# Patient Record
Sex: Male | Born: 1965 | Race: White | Hispanic: No | Marital: Married | Smoking: Former smoker
Health system: Southern US, Community
[De-identification: ages and names within clinical notes are randomized; demographics above are authoritative.]

## PROBLEM LIST (undated history)

## (undated) DIAGNOSIS — I499 Cardiac arrhythmia, unspecified: Secondary | ICD-10-CM

## (undated) DIAGNOSIS — M1990 Unspecified osteoarthritis, unspecified site: Secondary | ICD-10-CM

## (undated) DIAGNOSIS — I4510 Unspecified right bundle-branch block: Secondary | ICD-10-CM

## (undated) DIAGNOSIS — I10 Essential (primary) hypertension: Secondary | ICD-10-CM

## (undated) DIAGNOSIS — N189 Chronic kidney disease, unspecified: Secondary | ICD-10-CM

## (undated) DIAGNOSIS — E669 Obesity, unspecified: Secondary | ICD-10-CM

## (undated) DIAGNOSIS — E785 Hyperlipidemia, unspecified: Secondary | ICD-10-CM

## (undated) DIAGNOSIS — S83249A Other tear of medial meniscus, current injury, unspecified knee, initial encounter: Secondary | ICD-10-CM

## (undated) DIAGNOSIS — Z905 Acquired absence of kidney: Secondary | ICD-10-CM

## (undated) DIAGNOSIS — G4730 Sleep apnea, unspecified: Secondary | ICD-10-CM

## (undated) DIAGNOSIS — G4733 Obstructive sleep apnea (adult) (pediatric): Secondary | ICD-10-CM

## (undated) HISTORY — PX: KIDNEY DONATION: SHX685

## (undated) HISTORY — PX: SALIVARY STONE REMOVAL: SHX5213

## (undated) HISTORY — PX: CARPAL TUNNEL RELEASE: SHX101

---

## 1997-10-02 HISTORY — PX: CARPAL TUNNEL RELEASE: SHX101

## 2008-02-17 ENCOUNTER — Ambulatory Visit (HOSPITAL_COMMUNITY): Admission: RE | Admit: 2008-02-17 | Discharge: 2008-02-17 | Payer: Self-pay | Admitting: Urology

## 2008-02-25 ENCOUNTER — Ambulatory Visit (HOSPITAL_COMMUNITY): Admission: RE | Admit: 2008-02-25 | Discharge: 2008-02-25 | Payer: Self-pay | Admitting: Family Medicine

## 2008-12-02 ENCOUNTER — Ambulatory Visit (HOSPITAL_COMMUNITY): Admission: RE | Admit: 2008-12-02 | Discharge: 2008-12-02 | Payer: Self-pay | Admitting: Family Medicine

## 2009-09-30 ENCOUNTER — Ambulatory Visit (HOSPITAL_COMMUNITY): Admission: RE | Admit: 2009-09-30 | Discharge: 2009-09-30 | Payer: Self-pay | Admitting: Family Medicine

## 2010-10-24 ENCOUNTER — Ambulatory Visit (HOSPITAL_COMMUNITY)
Admission: RE | Admit: 2010-10-24 | Discharge: 2010-10-24 | Payer: Self-pay | Source: Home / Self Care | Attending: Family Medicine | Admitting: Family Medicine

## 2011-10-28 ENCOUNTER — Ambulatory Visit (HOSPITAL_COMMUNITY): Payer: Self-pay | Attending: Emergency Medicine

## 2011-10-28 ENCOUNTER — Emergency Department (HOSPITAL_COMMUNITY)
Admission: EM | Admit: 2011-10-28 | Discharge: 2011-10-28 | Disposition: A | Discharge status: Home / Self Care | Payer: Worker's Compensation | Attending: Emergency Medicine | Admitting: Emergency Medicine

## 2011-10-28 ENCOUNTER — Encounter (HOSPITAL_COMMUNITY): Payer: Self-pay

## 2011-10-28 DIAGNOSIS — W010XXA Fall on same level from slipping, tripping and stumbling without subsequent striking against object, initial encounter: Secondary | ICD-10-CM | POA: Insufficient documentation

## 2011-10-28 DIAGNOSIS — S8000XA Contusion of unspecified knee, initial encounter: Secondary | ICD-10-CM | POA: Insufficient documentation

## 2011-10-28 DIAGNOSIS — M25569 Pain in unspecified knee: Secondary | ICD-10-CM | POA: Insufficient documentation

## 2011-10-28 DIAGNOSIS — Y9210 Unspecified residential institution as the place of occurrence of the external cause: Secondary | ICD-10-CM | POA: Insufficient documentation

## 2011-10-28 MED ORDER — IBUPROFEN 800 MG PO TABS
800.0000 mg | ORAL_TABLET | Freq: Once | ORAL | Status: DC
  Filled 2011-10-28: qty 1

## 2011-10-28 MED ORDER — NAPROXEN 500 MG PO TABS: 500.0000 mg | ORAL_TABLET | Freq: Two times a day (BID) | ORAL | Status: AC

## 2011-10-28 MED ORDER — OXYCODONE-ACETAMINOPHEN 5-325 MG PO TABS
2.0000 | ORAL_TABLET | Freq: Once | ORAL | Status: DC
  Filled 2011-10-28: qty 2

## 2011-10-28 MED ORDER — OXYCODONE-ACETAMINOPHEN 5-325 MG PO TABS: 1.0000 | ORAL_TABLET | Freq: Four times a day (QID) | ORAL | Status: AC | PRN
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
## 2013-10-02 HISTORY — PX: SALIVARY STONE REMOVAL: SHX5213

## 2014-01-21 ENCOUNTER — Ambulatory Visit: Payer: Self-pay | Admitting: Physician Assistant

## 2014-01-21 LAB — DOT URINE DIP
BLOOD: NEGATIVE
GLUCOSE, UR: NEGATIVE mg/dL (ref 0–75)
Specific Gravity: 1.01 (ref 1.003–1.030)

## 2015-02-06 ENCOUNTER — Encounter: Payer: Self-pay | Admitting: Emergency Medicine

## 2015-02-06 ENCOUNTER — Emergency Department
Admission: EM | Admit: 2015-02-06 | Discharge: 2015-02-06 | Disposition: A | Discharge status: Home / Self Care | Payer: BLUE CROSS/BLUE SHIELD | Attending: Emergency Medicine | Admitting: Emergency Medicine

## 2015-02-06 DIAGNOSIS — Y9389 Activity, other specified: Secondary | ICD-10-CM | POA: Insufficient documentation

## 2015-02-06 DIAGNOSIS — Z23 Encounter for immunization: Secondary | ICD-10-CM | POA: Insufficient documentation

## 2015-02-06 DIAGNOSIS — W275XXA Contact with paper-cutter, initial encounter: Secondary | ICD-10-CM | POA: Diagnosis not present

## 2015-02-06 DIAGNOSIS — Y998 Other external cause status: Secondary | ICD-10-CM | POA: Insufficient documentation

## 2015-02-06 DIAGNOSIS — S61512A Laceration without foreign body of left wrist, initial encounter: Secondary | ICD-10-CM | POA: Diagnosis not present

## 2015-02-06 DIAGNOSIS — Y9289 Other specified places as the place of occurrence of the external cause: Secondary | ICD-10-CM | POA: Insufficient documentation

## 2015-02-06 DIAGNOSIS — I10 Essential (primary) hypertension: Secondary | ICD-10-CM | POA: Insufficient documentation

## 2015-02-06 DIAGNOSIS — Z87891 Personal history of nicotine dependence: Secondary | ICD-10-CM | POA: Insufficient documentation

## 2015-02-06 HISTORY — DX: Essential (primary) hypertension: I10

## 2015-02-06 MED ORDER — OXYCODONE-ACETAMINOPHEN 5-325 MG PO TABS
1.0000 | ORAL_TABLET | Freq: Once | ORAL | Status: AC
  Administered 2015-02-06: 1 via ORAL

## 2015-02-06 MED ORDER — LIDOCAINE-EPINEPHRINE 1 %-1:200000 IJ SOLN
INTRAMUSCULAR | Status: AC
Start: 2015-02-06 — End: 2015-02-06
  Administered 2015-02-06: 16:00:00
  Filled 2015-02-06: qty 30

## 2015-02-06 MED ORDER — TETANUS-DIPHTHERIA TOXOIDS TD 5-2 LFU IM INJ
0.5000 mL | INJECTION | Freq: Once | INTRAMUSCULAR | Status: AC
  Administered 2015-02-06: 0.5 mL via INTRAMUSCULAR

## 2015-02-06 MED ORDER — OXYCODONE-ACETAMINOPHEN 5-325 MG PO TABS
ORAL_TABLET | ORAL | Status: AC
  Administered 2015-02-06: 1 via ORAL
  Filled 2015-02-06: qty 1

## 2015-02-06 MED ORDER — TETANUS-DIPHTHERIA TOXOIDS TD 5-2 LFU IM INJ
INJECTION | INTRAMUSCULAR | Status: AC
  Administered 2015-02-06: 0.5 mL via INTRAMUSCULAR
  Filled 2015-02-06: qty 0.5

## 2015-02-06 MED ORDER — TRAMADOL HCL 50 MG PO TABS: 50.0000 mg | ORAL_TABLET | Freq: Four times a day (QID) | ORAL | Status: DC | PRN
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
## 2015-02-06 NOTE — ED Notes (Signed)
Cutting rug, sliced wrist, bleeding controlled with pressure en route and now not actively bleeding

## 2017-09-27 ENCOUNTER — Encounter (INDEPENDENT_AMBULATORY_CARE_PROVIDER_SITE_OTHER): Payer: Self-pay | Admitting: *Deleted

## 2017-10-30 ENCOUNTER — Other Ambulatory Visit (HOSPITAL_COMMUNITY)
Admission: RE | Admit: 2017-10-30 | Discharge: 2017-10-30 | Disposition: A | Discharge status: Home / Self Care | Payer: BLUE CROSS/BLUE SHIELD | Source: Ambulatory Visit | Attending: Urology | Admitting: Urology

## 2017-10-30 ENCOUNTER — Ambulatory Visit (INDEPENDENT_AMBULATORY_CARE_PROVIDER_SITE_OTHER): Payer: BLUE CROSS/BLUE SHIELD | Admitting: Urology

## 2017-10-30 DIAGNOSIS — R3121 Asymptomatic microscopic hematuria: Secondary | ICD-10-CM | POA: Insufficient documentation

## 2017-10-30 LAB — URINALYSIS, COMPLETE (UACMP) WITH MICROSCOPIC
BACTERIA UA: NONE SEEN
BILIRUBIN URINE: NEGATIVE
Glucose, UA: NEGATIVE mg/dL
HGB URINE DIPSTICK: NEGATIVE
Ketones, ur: NEGATIVE mg/dL
LEUKOCYTES UA: NEGATIVE
NITRITE: NEGATIVE
Protein, ur: 30 mg/dL — AB
SPECIFIC GRAVITY, URINE: 1.016 (ref 1.005–1.030)
Squamous Epithelial / LPF: NONE SEEN
pH: 7 (ref 5.0–8.0)

## 2017-11-02 ENCOUNTER — Ambulatory Visit
Admission: EM | Admit: 2017-11-02 | Discharge: 2017-11-02 | Disposition: A | Discharge status: Home / Self Care | Payer: BLUE CROSS/BLUE SHIELD | Attending: Family Medicine | Admitting: Family Medicine

## 2017-11-02 ENCOUNTER — Encounter: Payer: Self-pay | Admitting: *Deleted

## 2017-11-02 DIAGNOSIS — L723 Sebaceous cyst: Secondary | ICD-10-CM | POA: Diagnosis not present

## 2017-11-02 NOTE — ED Provider Notes (Signed)
[facility]    CSN: [id number] Arrival date & time: 11/02/17  0803  History   Chief Complaint Chief Complaint  Patient presents with  . Abscess   HPI  52 year old male presents with a lump on his left shoulder.  She states that he has had this for approximately 1 year.  Has been getting larger.  He states that it has become painful over the past week.  He states that it is inflamed and red.  No fluctuance.  No drainage.  No known exacerbating relieving factors.  No reports of fevers or chills.  No other associated symptoms.  No other complaints or concerns at this time.  Past Medical History:  Diagnosis Date  . Hypertension    Past Surgical History:  Procedure Laterality Date  . KIDNEY DONATION     Family History Family History  Problem Relation Age of Onset  . Cancer Mother   . Kidney failure Father     Social History Social History   Tobacco Use  . Smoking status: Former Research scientist (life sciences)  . Smokeless tobacco: Never Used  Substance Use Topics  . Alcohol use: Yes    Alcohol/week: 8.4 oz    Types: 14 Cans of beer per week  . Drug use: No     Allergies   Sulfa antibiotics   Review of Systems Review of Systems  Constitutional: Negative.   Skin:       Lump/knot - left shoulder. No drainage. Red.   Physical Exam Triage Vital Signs ED Triage Vitals  Enc Vitals Group     BP 11/02/17 0815 (!) 131/91     Pulse Rate 11/02/17 0815 77     Resp 11/02/17 0815 16     Temp 11/02/17 0815 97.8 F (36.6 C)     Temp Source 11/02/17 0815 Oral     SpO2 11/02/17 0815 98 %     Weight 11/02/17 0817 225 lb (102.1 kg)     Height 11/02/17 0817 5\' 9"  (1.753 m)     Head Circumference --      Peak Flow --      Pain Score --      Pain Loc --      Pain Edu? --      Excl. in [place]? --    Updated Vital Signs BP (!) 131/91 (BP Location: Left Arm)   Pulse 77   Temp 97.8 F (36.6 C) (Oral)   Resp 16   Ht 5\' 9"  (1.753 m)   Wt 225 lb (102.1 kg)   SpO2 98%   BMI 33.23  kg/m   Physical Exam  Constitutional: He is oriented to person, place, and time. He appears well-developed and well-nourished. No distress.  HENT:  Head: Normocephalic and atraumatic.  Eyes: Conjunctivae are normal.  Pulmonary/Chest: Effort normal. No respiratory distress.  Neurological: He is alert and oriented to person, place, and time.  Skin:  Left posterior shoulder -3.5 cm circumferential firm area.  Mild surrounding erythema.  No fluctuance.  No drainage.  Appears to be consistent with a cyst.  Psychiatric: He has a normal mood and affect. His behavior is normal.  Nursing note and vitals reviewed.  UC Treatments / Results  Labs (all labs ordered are listed, but only abnormal results are displayed) Labs Reviewed - No data to display  EKG  EKG Interpretation None       Radiology No results found.  Procedures Incision and Drainage Date/Time: 11/02/2017 9:23 AM Performed by: Coral Spikes,  DO Authorized by: Coral Spikes, DO   Consent:    Consent obtained:  Verbal Location:    Type:  Cyst   Size:  3.5 cm   Location:  Upper extremity   Upper extremity location:  Shoulder   Shoulder location:  L shoulder Pre-procedure details:    Skin preparation:  Betadine Anesthesia (see MAR for exact dosages):    Anesthesia method:  Local infiltration   Local anesthetic:  Lidocaine 1% WITH epi Procedure type:    Complexity:  Simple Procedure details:    Incision types:  Single straight   Scalpel blade:  15   Drainage characteristics: Cyst contents; No pus.   Wound treatment: Wound closed with 3 simple interupted sutures. Post-procedure details:    Patient tolerance of procedure:  Tolerated well, no immediate complications   (including critical care time)  Medications Ordered in UC Medications - No data to display   Initial Impression / Assessment and Plan / UC Course  I have reviewed the triage vital signs and the nursing notes.  Pertinent labs & imaging results  that were available during my care of the patient were reviewed by me and considered in my medical decision making (see chart for details).     52 year old male presents with a sebaceous cyst.  Cyst removed today.  There is no pus.  As a result, I closed the wound with 3 simple interrupted stitches.  See above.  Sutures out in 7 days.  Final Clinical Impressions(s) / UC Diagnoses   Final diagnoses:  Sebaceous cyst    ED Discharge Orders    None     Controlled Substance Prescriptions [place] Controlled Substance Registry consulted? Not Applicable   Coral Spikes, [place] 11/02/17 1497

## 2017-11-02 NOTE — ED Triage Notes (Signed)
Abcess left shoulder x several weeks.

## 2017-11-05 ENCOUNTER — Telehealth: Payer: Self-pay | Admitting: *Deleted

## 2017-11-05 NOTE — Telephone Encounter (Signed)
Called patient, verified DOB, patient reported feeling better. Reminded patient to return to [facility] for suture removal.

## 2018-04-05 ENCOUNTER — Encounter: Payer: Self-pay | Admitting: Internal Medicine

## 2018-05-13 ENCOUNTER — Ambulatory Visit: Payer: Self-pay

## 2018-05-20 ENCOUNTER — Ambulatory Visit (INDEPENDENT_AMBULATORY_CARE_PROVIDER_SITE_OTHER): Payer: Self-pay

## 2018-05-20 DIAGNOSIS — Z1211 Encounter for screening for malignant neoplasm of colon: Secondary | ICD-10-CM

## 2018-05-20 MED ORDER — NA SULFATE-K SULFATE-MG SULF 17.5-3.13-1.6 GM/177ML PO SOLN: 1.0000 | ORAL | 0 refills | Status: DC

## 2018-05-20 NOTE — Patient Instructions (Addendum)
Rodney Brooks   April 17, 1966 MRN: [id number]    Procedure Date: 11/22/18 Time to register: 8:30am Place to register: [facility] Procedure Time: 9:30am Scheduled provider: Barney Drain, MD  PREPARATION FOR COLONOSCOPY WITH TRI-LYTE SPLIT PREP  Please notify us immediately if you are diabetic, take iron supplements, or if you are on Coumadin or any other blood thinners.   You will need to purchase 1 fleet enema and 1 box of Bisacodyl 53m tablets. ( you will need to purchase these seperately at your pharmacy)    1 DAY BEFORE PROCEDURE:  DATE: 11/21/18  DAY: Thursday clear liquids the entire day - NO SOLID FOOD.   At 2:00 pm:  Take 2 Bisacodyl tablets.   At 4:00pm:  Start drinking your solution. Make sure you mix well per instructions on the bottle. Try to drink 1 (one) 8 ounce glass every 10-15 minutes until you have consumed HALF the jug. You should complete by 6:00pm.You must keep the left over solution refrigerated until completed next day.  Continue clear liquids. You must drink plenty of clear liquids to prevent dehyration and kidney failure.     DAY OF PROCEDURE:   DATE: 11/22/18   DAY: Friday If you take medications for your heart, blood pressure or breathing, you may take these medications.   Five hours before your procedure time @ 4:30am:  Finish remaining amout of bowel prep, drinking 1 (one) 8 ounce glass every 10-15 minutes until complete. You have two hours to consume remaining prep.   Three hours before your procedure time @6 :30am:  Nothing by mouth.   At least one hour before going to the hospital:  Give yourself one Fleet enema. You may take your morning medications with sip of water unless we have instructed otherwise.      Please see below for Dietary Information.  CLEAR LIQUIDS INCLUDE:  Water Jello (NOT red in color)   Ice Popsicles (NOT red in color)   Tea (sugar ok, no milk/cream) Powdered fruit flavored drinks  Coffee (sugar ok, no  milk/cream) Gatorade/ Lemonade/ Kool-Aid  (NOT red in color)   Juice: apple, white grape, white cranberry Soft drinks  Clear bullion, consomme, broth (fat free beef/chicken/vegetable)  Carbonated beverages (any kind)  Strained chicken noodle soup Hard Candy   Remember: Clear liquids are liquids that will allow you to see your fingers on the other side of a clear glass. Be sure liquids are NOT red in color, and not cloudy, but CLEAR.  DO NOT EAT OR DRINK ANY OF THE FOLLOWING:  Dairy products of any kind   Cranberry juice Tomato juice / V8 juice   Grapefruit juice Orange juice     Red grape juice  Do not eat any solid foods, including such foods as: cereal, oatmeal, yogurt, fruits, vegetables, creamed soups, eggs, bread, crackers, pureed foods in a blender, etc.   HELPFUL HINTS FOR DRINKING PREP SOLUTION:   Make sure prep is extremely cold. Mix and refrigerate the the morning of the prep. You may also put in the freezer.   You may try mixing some Crystal Light or Country Time Lemonade if you prefer. Mix in small amounts; add more if necessary.  Try drinking through a straw  Rinse mouth with water or a mouthwash between glasses, to remove after-taste.  Try sipping on a cold beverage /ice/ popsicles between glasses of prep.  Place a piece of sugar-free hard candy in mouth between glasses.  If you become nauseated, try consuming  smaller amounts, or stretch out the time between glasses. Stop for 30-60 minutes, then slowly start back drinking.        OTHER INSTRUCTIONS  You will need a responsible adult at least 52 years of age to accompany you and drive you home. This person must remain in the waiting room during your procedure. The hospital will cancel your procedure if you do not have a responsible adult with you.   1. Wear loose fitting clothing that is easily removed. 2. Leave jewelry and other valuables at home.  3. Remove all body piercing jewelry and leave at  home. 4. Total time from sign-in until discharge is approximately 2-3 hours. 5. You should go home directly after your procedure and rest. You can resume normal activities the day after your procedure. 6. The day of your procedure you should not:  Drive  Make legal decisions  Operate machinery  Drink alcohol  Return to work   You may call the office (Dept: [phone]) before 5:00pm, or page the doctor on call [phone]) after 5:00pm, for further instructions, if necessary.   Insurance Information YOU WILL NEED TO CHECK WITH YOUR INSURANCE COMPANY FOR THE BENEFITS OF COVERAGE YOU HAVE FOR THIS PROCEDURE.  UNFORTUNATELY, NOT ALL INSURANCE COMPANIES HAVE BENEFITS TO COVER ALL OR PART OF THESE TYPES OF PROCEDURES.  IT IS YOUR RESPONSIBILITY TO CHECK YOUR BENEFITS, HOWEVER, WE WILL BE GLAD TO ASSIST YOU WITH ANY CODES YOUR INSURANCE COMPANY MAY NEED.    PLEASE NOTE THAT MOST INSURANCE COMPANIES WILL NOT COVER A SCREENING COLONOSCOPY FOR PEOPLE UNDER THE AGE OF 50  IF YOU HAVE BCBS INSURANCE, YOU MAY HAVE BENEFITS FOR A SCREENING COLONOSCOPY BUT IF POLYPS ARE FOUND THE DIAGNOSIS WILL CHANGE AND THEN YOU MAY HAVE A DEDUCTIBLE THAT WILL NEED TO BE MET. SO PLEASE MAKE SURE YOU CHECK YOUR BENEFITS FOR A SCREENING COLONOSCOPY AS WELL AS A DIAGNOSTIC COLONOSCOPY.

## 2018-05-20 NOTE — Progress Notes (Addendum)
Gastroenterology Pre-Procedure Review  Request Date:05/20/18 Requesting Physician: Burman Freestone NP [facility]- no previous tcs  PATIENT REVIEW QUESTIONS: The patient responded to the following health history questions as indicated:    1. Diabetes Melitis: no 2. Joint replacements in the past 12 months: no 3. Major health problems in the past 3 months: no 4. Has an artificial valve or MVP: no 5. Has a defibrillator: no 6. Has been advised in past to take antibiotics in advance of a procedure like teeth cleaning: no 7. Family history of colon cancer: no  8. Alcohol Use: yes (occasionally has a beer. ) 9. History of sleep apnea: yes (does not have cpap yet, he is currently being tested for sleep apnea)   10. History of coronary artery or other vascular stents placed within the last 12 months: no 11. History of any prior anesthesia complications: no    MEDICATIONS & ALLERGIES:    Patient reports the following regarding taking any blood thinners:   Plavix? no Aspirin? no Coumadin? no Brilinta? no Xarelto? no Eliquis? no Pradaxa? no Savaysa? no Effient? no  Patient confirms/reports the following medications:  Current Outpatient Medications  Medication Sig Dispense Refill  . hydrochlorothiazide (MICROZIDE) 12.5 MG capsule daily.  2  . lisinopril (PRINIVIL,ZESTRIL) 10 MG tablet Take by mouth daily.    . simvastatin (ZOCOR) 40 MG tablet daily.  0   No current facility-administered medications for this visit.     Patient confirms/reports the following allergies:  Allergies  Allergen Reactions  . Sulfa Antibiotics Rash    No orders of the defined types were placed in this encounter.   AUTHORIZATION INFORMATION Primary Insurance: [facility]   ID #: [id number] Pre-Cert / Josem Kaufmann required: yes Pre-Cert / Auth #: [id number]   SCHEDULE INFORMATION: Procedure has been scheduled as follows:  Date: 07/19/18, Time: 2:30 Location: [facility]  This Gastroenterology  Pre-Precedure Review Form is being routed to the following provider(s): Walden Field NP

## 2018-05-21 NOTE — Progress Notes (Signed)
Ok to schedule. Occasional beer doesn't necessitate propofol.

## 2018-05-22 ENCOUNTER — Telehealth: Payer: Self-pay

## 2018-05-22 MED ORDER — PEG 3350-KCL-NA BICARB-NACL 420 G PO SOLR: 4000.0000 mL | ORAL | 0 refills | Status: DC

## 2018-05-22 NOTE — Progress Notes (Signed)
Pt called- suprep is too expensive. rx sent for trilyte and new instructions mailed to the pt. See phone note.

## 2018-05-22 NOTE — Telephone Encounter (Signed)
trilyte sent to the pharmacy and new instructions mailed to the pt.

## 2018-05-22 NOTE — Telephone Encounter (Signed)
[facility] [employer] called office, Suprep is too expensive. Pt requests cheaper prep. Routing to [employer].

## 2018-05-22 NOTE — Addendum Note (Signed)
Addended by: Claudina Lick on: 05/22/2018 05:06 PM   Modules accepted: Orders

## 2018-07-23 NOTE — Progress Notes (Signed)
Pt got a new job and needed to reschedule. He has been rescheduled for 10/04/18 and Hoyle Sauer is aware. Pt already has prep and new instructions done and mailed to the pt.

## 2018-09-04 ENCOUNTER — Telehealth: Payer: Self-pay

## 2018-09-04 NOTE — Telephone Encounter (Signed)
Due to a change in [facility] schedule, I had to reschedule the pts colonoscopy. Tried to call home number, NA and no voicemail. Tried to call work number- it has been disconnected. No release on file to be able to speak with anyone else. I have moved his procedure date to 11/22/18 and mailed him a letter with new instructions. Asked him to call if he needed to reschedule to another date and time.

## 2018-11-22 ENCOUNTER — Other Ambulatory Visit: Payer: Self-pay

## 2018-11-22 ENCOUNTER — Ambulatory Visit (HOSPITAL_COMMUNITY)
Admission: RE | Admit: 2018-11-22 | Discharge: 2018-11-22 | Disposition: A | Discharge status: Home / Self Care | Payer: BLUE CROSS/BLUE SHIELD | Attending: Gastroenterology | Admitting: Gastroenterology

## 2018-11-22 ENCOUNTER — Encounter (HOSPITAL_COMMUNITY)
Admission: RE | Disposition: A | Discharge status: Home / Self Care | Payer: Self-pay | Source: Home / Self Care | Attending: Gastroenterology

## 2018-11-22 ENCOUNTER — Encounter (HOSPITAL_COMMUNITY): Payer: Self-pay | Admitting: *Deleted

## 2018-11-22 DIAGNOSIS — I10 Essential (primary) hypertension: Secondary | ICD-10-CM | POA: Insufficient documentation

## 2018-11-22 DIAGNOSIS — K5730 Diverticulosis of large intestine without perforation or abscess without bleeding: Secondary | ICD-10-CM | POA: Diagnosis not present

## 2018-11-22 DIAGNOSIS — K644 Residual hemorrhoidal skin tags: Secondary | ICD-10-CM | POA: Insufficient documentation

## 2018-11-22 DIAGNOSIS — Z1211 Encounter for screening for malignant neoplasm of colon: Secondary | ICD-10-CM

## 2018-11-22 DIAGNOSIS — Z79899 Other long term (current) drug therapy: Secondary | ICD-10-CM | POA: Diagnosis not present

## 2018-11-22 DIAGNOSIS — K648 Other hemorrhoids: Secondary | ICD-10-CM | POA: Insufficient documentation

## 2018-11-22 DIAGNOSIS — Z87891 Personal history of nicotine dependence: Secondary | ICD-10-CM | POA: Insufficient documentation

## 2018-11-22 HISTORY — PX: COLONOSCOPY: SHX5424

## 2018-11-22 SURGERY — COLONOSCOPY
Anesthesia: Moderate Sedation

## 2018-11-22 MED ORDER — MIDAZOLAM HCL 5 MG/5ML IJ SOLN
INTRAMUSCULAR | Status: DC | PRN
  Administered 2018-11-22 (×2): 2 mg via INTRAVENOUS

## 2018-11-22 MED ORDER — SODIUM CHLORIDE 0.9 % IV SOLN
INTRAVENOUS | Status: DC
  Administered 2018-11-22: 1000 mL via INTRAVENOUS

## 2018-11-22 MED ORDER — MEPERIDINE HCL 100 MG/ML IJ SOLN
INTRAMUSCULAR | Status: DC | PRN
  Administered 2018-11-22 (×2): 25 mg

## 2018-11-22 MED ORDER — MEPERIDINE HCL 100 MG/ML IJ SOLN
INTRAMUSCULAR | Status: AC
  Filled 2018-11-22: qty 2

## 2018-11-22 MED ORDER — PROMETHAZINE HCL 25 MG/ML IJ SOLN
INTRAMUSCULAR | Status: AC
  Filled 2018-11-22: qty 1

## 2018-11-22 MED ORDER — MIDAZOLAM HCL 5 MG/5ML IJ SOLN
INTRAMUSCULAR | Status: AC
  Filled 2018-11-22: qty 10

## 2018-11-22 MED ORDER — PROMETHAZINE HCL 25 MG/ML IJ SOLN
INTRAMUSCULAR | Status: DC | PRN
  Administered 2018-11-22: 12.5 mg via INTRAVENOUS

## 2018-11-22 NOTE — H&P (Signed)
Primary Care Physician:  Joyice Faster, FNP Primary Gastroenterologist:  Dr. Oneida Alar  Pre-Procedure History & Physical: HPI:  Rodney Brooks is a 53 y.o. male here for [place].  Past Medical History:  Diagnosis Date  . Hypertension     Past Surgical History:  Procedure Laterality Date  . CARPAL TUNNEL RELEASE Bilateral   . KIDNEY DONATION      Prior to Admission medications   Medication Sig Start Date End Date Taking? Authorizing Provider  hydrochlorothiazide (MICROZIDE) 12.5 MG capsule daily. 04/30/18  Yes [provider]  lisinopril (PRINIVIL,ZESTRIL) 10 MG tablet Take by mouth daily.   Yes [provider]  polyethylene glycol-electrolytes (TRILYTE) 420 g solution Take 4,000 mLs by mouth as directed. 05/22/18  Yes Carlis Stable, NP  simvastatin (ZOCOR) 40 MG tablet daily. 04/30/18  Yes [provider]  Na Sulfate-K Sulfate-Mg Sulf (SUPREP BOWEL PREP KIT) 17.5-3.13-1.6 GM/177ML SOLN Take 1 kit by mouth as directed. 05/20/18   Carlis Stable, NP    Allergies as of 05/20/2018 - Review Complete 05/20/2018  Allergen Reaction Noted  . Sulfa antibiotics Rash 10/28/2011    Family History  Problem Relation Age of Onset  . Cancer Mother   . Kidney failure Father     Social History   Socioeconomic History  . Marital status: Married    Spouse name: Not on file  . Number of children: Not on file  . Years of education: Not on file  . Highest education level: Not on file  Occupational History  . Not on file  Social Needs  . Financial resource strain: Not on file  . Food insecurity:    Worry: Not on file    Inability: Not on file  . Transportation needs:    Medical: Not on file    Non-medical: Not on file  Tobacco Use  . Smoking status: Former Research scientist (life sciences)  . Smokeless tobacco: Current User    Types: Snuff  Substance and Sexual Activity  . Alcohol use: Yes    Alcohol/week: 14.0 standard drinks    Types: 14 Cans of beer per week   . Drug use: No  . Sexual activity: Not on file  Lifestyle  . Physical activity:    Days per week: Not on file    Minutes per session: Not on file  . Stress: Not on file  Relationships  . Social connections:    Talks on phone: Not on file    Gets together: Not on file    Attends religious service: Not on file    Active member of club or organization: Not on file    Attends meetings of clubs or organizations: Not on file    Relationship status: Not on file  . Intimate partner violence:    Fear of current or ex partner: Not on file    Emotionally abused: Not on file    Physically abused: Not on file    Forced sexual activity: Not on file  Other Topics Concern  . Not on file  Social History Narrative  . Not on file    Review of Systems: See HPI, otherwise negative ROS   Physical Exam: BP 133/84   Pulse 79   Temp 99.2 F (37.3 C) (Oral)   Resp 13   Ht _0  (1.753 m)   Wt 107 kg   SpO2 98%   BMI 34.85 kg/m  General:   Alert,  pleasant and cooperative in NAD Head:  Normocephalic  and atraumatic. Neck:  Supple; Lungs:  Clear throughout to auscultation.    Heart:  Regular rate and rhythm. Abdomen:  Soft, nontender and nondistended. Normal bowel sounds, without guarding, and without rebound.   Neurologic:  Alert and  oriented x4;  grossly normal neurologically.  Impression/Plan:    SCREENING  Plan:  1. TCS TODAY DISCUSSED PROCEDURE, BENEFITS, & RISKS: < 1% chance of medication reaction, bleeding, perforation, or rupture of spleen/liver.

## 2018-11-22 NOTE — Discharge Instructions (Signed)
You DID NOT HAVE ANY POLYPS. YOU HAVE DIVERTICULOSIS IN YOUR RIGHT COLON. You have internal AND EXTERNAL hemorrhoids.   DRINK WATER TO KEEP URINE LIGHT YELLOW.  CONTINUE YOUR WEIGHT LOSS EFFORTS. YOUR BODY MASS INDEX IS OVER 30 WHICH MEANS YOU ARE OBESE. OBESITY IS ASSOCIATED WITH AN INCREASED FOR CIRRHOSIS AND ALL CANCERS, INCLUDING ESOPHAGEAL AND COLON CANCER. A WEIGHT OF 200 LBS OR LESS  WILL GET YOUR BODY MASS INDEX(BMI) UNDER 30.   FOLLOW A HIGH FIBER DIET. AVOID ITEMS THAT CAUSE BLOATING & GAS. SEE INFO BELOW.   USE PREPARATION H FOUR TIMES  A DAY IF NEEDED TO RELIEVE RECTAL PAIN/PRESSURE/BLEEDING.  Next colonoscopy in 10 years.   Colonoscopy Care After Read the instructions outlined below and refer to this sheet in the next week. These discharge instructions provide you with general information on caring for yourself after you leave the hospital. While your treatment has been planned according to the most current medical practices available, unavoidable complications occasionally occur. If you have any problems or questions after discharge, call DR. Nadirah Socorro, [phone].  ACTIVITY  You may resume your regular activity, but move at a slower pace for the next 24 hours.   Take frequent rest periods for the next 24 hours.   Walking will help get rid of the air and reduce the bloated feeling in your belly (abdomen).   No driving for 24 hours (because of the medicine (anesthesia) used during the test).   You may shower.   Do not sign any important legal documents or operate any machinery for 24 hours (because of the anesthesia used during the test).    NUTRITION  Drink plenty of fluids.   You may resume your normal diet as instructed by your doctor.   Begin with a light meal and progress to your normal diet. Heavy or fried foods are harder to digest and may make you feel sick to your stomach (nauseated).   Avoid alcoholic beverages for 24 hours or as instructed.     MEDICATIONS  You may resume your normal medications.   WHAT YOU CAN EXPECT TODAY  Some feelings of bloating in the abdomen.   Passage of more gas than usual.   Spotting of blood in your stool or on the toilet paper  .  IF YOU HAD POLYPS REMOVED DURING THE COLONOSCOPY:  Eat a soft diet IF YOU HAVE NAUSEA, BLOATING, ABDOMINAL PAIN, OR VOMITING.    FINDING OUT THE RESULTS OF YOUR TEST Not all test results are available during your visit. DR. Oneida Alar WILL CALL YOU WITHIN 7 DAYS OF YOUR PROCEDUE WITH YOUR RESULTS. Do not assume everything is normal if you have not heard from DR. Lev Cervone IN ONE WEEK, CALL HER OFFICE AT [phone].  SEEK IMMEDIATE MEDICAL ATTENTION AND CALL THE OFFICE: [phone] IF:  You have more than a spotting of blood in your stool.   Your belly is swollen (abdominal distention).   You are nauseated or vomiting.   You have a temperature over 101F.   You have abdominal pain or discomfort that is severe or gets worse throughout the day.    High-Fiber Diet A high-fiber diet changes your normal diet to include more whole grains, legumes, fruits, and vegetables. Changes in the diet involve replacing refined carbohydrates with unrefined foods. The calorie level of the diet is essentially unchanged. The Dietary Reference Intake (recommended amount) for adult males is 38 grams per day. For adult females, it is 25 grams per day. Pregnant and  lactating women should consume 28 grams of fiber per day. Fiber is the intact part of a plant that is not broken down during digestion. Functional fiber is fiber that has been isolated from the plant to provide a beneficial effect in the body.  PURPOSE  Increase stool bulk.   Ease and regulate bowel movements.   Lower cholesterol.   REDUCE RISK OF COLON CANCER  INDICATIONS THAT YOU NEED MORE FIBER  Constipation and hemorrhoids.   Uncomplicated diverticulosis (intestine condition) and irritable bowel syndrome.    Weight management.   As a protective measure against hardening of the arteries (atherosclerosis), diabetes, and cancer.   GUIDELINES FOR INCREASING FIBER IN THE DIET  Start adding fiber to the diet slowly. A gradual increase of about 5 more grams (2 slices of whole-wheat bread, 2 servings of most fruits or vegetables, or 1 bowl of high-fiber cereal) per day is best. Too rapid an increase in fiber may result in constipation, flatulence, and bloating.   Drink enough water and fluids to keep your urine clear or pale yellow. Water, juice, or caffeine-free drinks are recommended. Not drinking enough fluid may cause constipation.   Eat a variety of high-fiber foods rather than one type of fiber.   Try to increase your intake of fiber through using high-fiber foods rather than fiber pills or supplements that contain small amounts of fiber.   The goal is to change the types of food eaten. Do not supplement your present diet with high-fiber foods, but replace foods in your present diet.   INCLUDE A VARIETY OF FIBER SOURCES  Replace refined and processed grains with whole grains, canned fruits with fresh fruits, and incorporate other fiber sources. White rice, white breads, and most bakery goods contain little or no fiber.   Brown whole-grain rice, buckwheat oats, and many fruits and vegetables are all good sources of fiber. These include: broccoli, Brussels sprouts, cabbage, cauliflower, beets, sweet potatoes, white potatoes (skin on), carrots, tomatoes, eggplant, squash, berries, fresh fruits, and dried fruits.   Cereals appear to be the richest source of fiber. Cereal fiber is found in whole grains and bran. Bran is the fiber-rich outer coat of cereal grain, which is largely removed in refining. In whole-grain cereals, the bran remains. In breakfast cereals, the largest amount of fiber is found in those with "bran" in their names. The fiber content is sometimes indicated on the label.   You may  need to include additional fruits and vegetables each day.   In baking, for 1 cup white flour, you may use the following substitutions:   1 cup whole-wheat flour minus 2 tablespoons.   1/2 cup white flour plus 1/2 cup whole-wheat flour.   Diverticulosis Diverticulosis is a common condition that develops when small pouches (diverticula) form in the wall of the colon. The risk of diverticulosis increases with age. It happens more often in people who eat a low-fiber diet. Most individuals with diverticulosis have no symptoms. Those individuals with symptoms usually experience belly (abdominal) pain, constipation, or loose stools (diarrhea).  HOME CARE INSTRUCTIONS  Increase the amount of fiber in your diet as directed by your caregiver or dietician. This may reduce symptoms of diverticulosis.   Drink at least 6 to 8 glasses of water each day to prevent constipation.   Try not to strain when you have a bowel movement.   THERE IS NO NEED TO Avoid nuts and seeds to prevent complications.   FOODS HAVING HIGH FIBER CONTENT INCLUDE:  Fruits. Apple, peach, pear, tangerine, raisins, prunes.   Vegetables. Brussels sprouts, asparagus, broccoli, cabbage, carrot, cauliflower, romaine lettuce, spinach, summer squash, tomato, winter squash, zucchini.   Starchy Vegetables. Baked beans, kidney beans, lima beans, split peas, lentils, potatoes (with skin).   Grains. Whole wheat bread, brown rice, bran flake cereal, plain oatmeal, white rice, shredded wheat, bran muffins.

## 2018-11-22 NOTE — Op Note (Signed)
[facility] Patient Name: Rodney Brooks Procedure Date: 11/22/2018 9:21 AM MRN: [id number] Date of Birth: 06-Nov-1965 Attending MD: Barney Drain MD, MD CSN: [id number] Age: 53 Admit Type: Outpatient Procedure:                Colonoscopy, SCREENING Indications:              Screening for colorectal malignant neoplasm Providers:                Barney Drain MD, MD, Janeece Riggers, RN, Nelma Rothman,                            Technician Referring MD:             Joyice Faster Medicines:                Promethazine 12.5 mg IV, Meperidine 50 mg IV,                            Midazolam 4 mg IV Complications:            No immediate complications. Estimated Blood Loss:     Estimated blood loss: none. Procedure:                Pre-Anesthesia Assessment:                           - Prior to the procedure, a History and Physical                            was performed, and patient medications and                            allergies were reviewed. The patient's tolerance of                            previous anesthesia was also reviewed. The risks                            and benefits of the procedure and the sedation                            options and risks were discussed with the patient.                            All questions were answered, and informed consent                            was obtained. Prior Anticoagulants: The patient has                            taken no previous anticoagulant or antiplatelet                            agents. ASA Grade Assessment: II - A patient with  mild systemic disease. After reviewing the risks                            and benefits, the patient was deemed in                            satisfactory condition to undergo the procedure.                            After obtaining informed consent, the colonoscope                            was passed under direct vision. Throughout the   procedure, the patient's blood pressure, pulse, and                            oxygen saturations were monitored continuously. The                            CF-[id number] ([id number]) scope was introduced through                            the anus and advanced to the the cecum, identified                            by appendiceal orifice and ileocecal valve. The                            colonoscopy was performed with ease. The patient                            tolerated the procedure well. The quality of the                            bowel preparation was good. The ileocecal valve,                            appendiceal orifice, and rectum were photographed. Scope In: 9:38:02 AM Scope Out: 9:47:19 AM Scope Withdrawal Time: 0 hours 7 minutes 56 seconds  Total Procedure Duration: 0 hours 9 minutes 17 seconds  Findings:      A few small-mouthed diverticula were found in the cecum.      The exam was otherwise without abnormality.      External and internal hemorrhoids were found. Impression:               - Diverticulosis in the cecum.                           - The examination was otherwise normal.                           - External and internal hemorrhoids. Moderate Sedation:      Moderate (conscious) sedation was administered by the endoscopy nurse       and supervised by the endoscopist. The following parameters  were       monitored: oxygen saturation, heart rate, blood pressure, and response       to care. Total physician intraservice time was 21 minutes. Recommendation:           - Patient has a contact number available for                            emergencies. The signs and symptoms of potential                            delayed complications were discussed with the                            patient. Return to normal activities tomorrow.                            Written discharge instructions were provided to the                            patient.                            - High fiber diet.                           - Continue present medications. LOSE WEIGHT TO BMI                            < 30.                           - Repeat colonoscopy in 10 years for surveillance. Procedure Code(s):        --- Professional ---                           [phone], Colonoscopy, flexible; diagnostic, including                            collection of specimen(s) by brushing or washing,                            when performed (separate procedure)                           G0500, Moderate sedation services provided by the                            same physician or other qualified health care                            professional performing a gastrointestinal                            endoscopic service that sedation supports,  requiring the presence of an independent trained                            observer to assist in the monitoring of the                            patient's level of consciousness and physiological                            status; initial 15 minutes of intra-service time;                            patient age 64 years or older (additional time may                            be reported with [phone], as appropriate) Diagnosis Code(s):        --- Professional ---                           Z12.11, Encounter for screening for malignant                            neoplasm of colon                           K64.8, Other hemorrhoids                           K57.30, Diverticulosis of large intestine without                            perforation or abscess without bleeding CPT copyright 2018 American [employer] Association. All rights reserved. The codes documented in this report are preliminary and upon coder review may  be revised to meet current compliance requirements. Barney Drain, MD Barney Drain MD, MD 11/22/2018 10:55:45 AM This report has been signed electronically. Number of Addenda: 0

## 2018-11-26 ENCOUNTER — Encounter (HOSPITAL_COMMUNITY): Payer: Self-pay | Admitting: Gastroenterology

## 2019-03-20 ENCOUNTER — Other Ambulatory Visit: Payer: Self-pay | Admitting: Student

## 2019-03-20 DIAGNOSIS — M25561 Pain in right knee: Secondary | ICD-10-CM

## 2019-03-20 DIAGNOSIS — M2391 Unspecified internal derangement of right knee: Secondary | ICD-10-CM

## 2019-03-25 ENCOUNTER — Other Ambulatory Visit: Payer: Self-pay

## 2019-03-25 ENCOUNTER — Ambulatory Visit
Admission: RE | Admit: 2019-03-25 | Discharge: 2019-03-25 | Disposition: A | Discharge status: Home / Self Care | Payer: BC Managed Care – PPO | Source: Ambulatory Visit | Attending: Student | Admitting: Student

## 2019-03-25 DIAGNOSIS — M2391 Unspecified internal derangement of right knee: Secondary | ICD-10-CM

## 2019-03-25 DIAGNOSIS — M25561 Pain in right knee: Secondary | ICD-10-CM

## 2019-04-18 ENCOUNTER — Other Ambulatory Visit: Payer: Self-pay

## 2019-04-18 ENCOUNTER — Encounter
Admission: RE | Admit: 2019-04-18 | Discharge: 2019-04-18 | Disposition: A | Discharge status: Home / Self Care | Payer: BC Managed Care – PPO | Source: Ambulatory Visit | Attending: Surgery | Admitting: Surgery

## 2019-04-18 DIAGNOSIS — Z01818 Encounter for other preprocedural examination: Secondary | ICD-10-CM | POA: Insufficient documentation

## 2019-04-18 DIAGNOSIS — I10 Essential (primary) hypertension: Secondary | ICD-10-CM | POA: Diagnosis not present

## 2019-04-18 HISTORY — DX: Sleep apnea, unspecified: G47.30

## 2019-04-18 HISTORY — DX: Acquired absence of kidney: Z90.5

## 2019-04-18 NOTE — Patient Instructions (Signed)
Your procedure is scheduled on: 04-24-19 THURSDAY Report to Same Day Surgery 2nd floor medical mall [facility] Entrance-take elevator on left to 2nd floor.  Check in with surgery information desk.) To find out your arrival time please call [phone] between 1PM - 3PM on 04-23-19 [facility]  Remember: Instructions that are not followed completely may result in serious medical risk, up to and including death, or upon the discretion of your surgeon and anesthesiologist your surgery may need to be rescheduled.    _x___ 1. Do not eat food after midnight the night before your procedure. NO GUM OR CANDY AFTER MIDNIGHT. You may drink clear liquids up to 2 hours before you are scheduled to arrive at the hospital for your procedure.  Do not drink clear liquids within 2 hours of your scheduled arrival to the hospital.  Clear liquids include  --Water or Apple juice without pulp  --Clear carbohydrate beverage such as ClearFast or Gatorade  --Black Coffee or Clear Tea (No milk, no creamers, do not add anything to the coffee or Tea   ____Ensure clear carbohydrate drink on the way to the hospital for bariatric patients  ____Ensure clear carbohydrate drink 3 hours before surgery for Dr Dwyane Luo patients if physician instructed.     __x__ 2. No Alcohol for 24 hours before or after surgery.   __x__3. No Smoking or e-cigarettes for 24 prior to surgery.  Do not use any chewable tobacco products for at least 6 hour prior to surgery   ____  4. Bring all medications with you on the day of surgery if instructed.    __x__ 5. Notify your doctor if there is any change in your medical condition     (cold, fever, infections).    x___6. On the morning of surgery brush your teeth with toothpaste and water.  You may rinse your mouth with mouth wash if you wish.  Do not swallow any toothpaste or mouthwash.   Do not wear jewelry, make-up, hairpins, clips or nail polish.  Do not wear lotions, powders, or perfumes.  You may wear deodorant.  Do not shave 48 hours prior to surgery. Men may shave face and neck.  Do not bring valuables to the hospital.    [facility] is not responsible for any belongings or valuables.               Contacts, dentures or bridgework may not be worn into surgery.  Leave your suitcase in the car. After surgery it may be brought to your room.  For patients admitted to the hospital, discharge time is determined by your treatment team.  _  Patients discharged the day of surgery will not be allowed to drive home.  You will need someone to drive you home and stay with you the night of your procedure.    Please read over the following fact sheets that you were given:   [facility] Preparing for Surgery   _x___ TAKE THE FOLLOWING MEDICATION THE MORNING OF SURGERY WITH A SMALL SIP OF WATER. These include:  1. ZOCOR (SIMVASTATIN)   2.  3.  4.  5.  6.  ____Fleets enema or Magnesium Citrate as directed.   _x___ Use CHG Soap or sage wipes as directed on instruction sheet   ____ Use inhalers on the day of surgery and bring to hospital day of surgery  ____ Stop Metformin and Janumet 2 days prior to surgery.    ____ Take 1/2 of usual insulin dose the night  before surgery and none on the morning surgery.   ____ Follow recommendations from Cardiologist, Pulmonologist or PCP regarding stopping Aspirin, Coumadin, Plavix ,Eliquis, Effient, or Pradaxa, and Pletal.  X____Stop Anti-inflammatories such as Advil, Aleve, Ibuprofen, Motrin, Naproxen, Naprosyn, Goodies powders or aspirin products NOW- OK to take Tylenol    ____ Stop supplements until after surgery.     ____ Bring C-Pap to the hospital.

## 2019-04-21 ENCOUNTER — Other Ambulatory Visit: Payer: Self-pay

## 2019-04-21 ENCOUNTER — Encounter
Admission: RE | Admit: 2019-04-21 | Discharge: 2019-04-21 | Disposition: A | Discharge status: Home / Self Care | Payer: BC Managed Care – PPO | Source: Ambulatory Visit | Attending: Surgery | Admitting: Surgery

## 2019-04-21 DIAGNOSIS — Z1159 Encounter for screening for other viral diseases: Secondary | ICD-10-CM | POA: Insufficient documentation

## 2019-04-21 DIAGNOSIS — Z01818 Encounter for other preprocedural examination: Secondary | ICD-10-CM | POA: Insufficient documentation

## 2019-04-21 LAB — POTASSIUM: Potassium: 3.8 mmol/L (ref 3.5–5.1)

## 2019-04-22 ENCOUNTER — Other Ambulatory Visit: Payer: BC Managed Care – PPO

## 2019-04-22 LAB — SARS CORONAVIRUS 2 (TAT 6-24 HRS): SARS Coronavirus 2: NEGATIVE

## 2019-04-23 MED ORDER — CEFAZOLIN SODIUM-DEXTROSE 2-4 GM/100ML-% IV SOLN
2.0000 g | Freq: Once | INTRAVENOUS | Status: AC
  Administered 2019-04-24: 2 g via INTRAVENOUS

## 2019-04-24 ENCOUNTER — Ambulatory Visit: Payer: BC Managed Care – PPO | Admitting: Certified Registered"

## 2019-04-24 ENCOUNTER — Ambulatory Visit
Admission: RE | Admit: 2019-04-24 | Discharge: 2019-04-24 | Disposition: A | Discharge status: Home / Self Care | Payer: BC Managed Care – PPO | Attending: Surgery | Admitting: Surgery

## 2019-04-24 ENCOUNTER — Encounter: Payer: Self-pay | Admitting: *Deleted

## 2019-04-24 ENCOUNTER — Encounter
Admission: RE | Disposition: A | Discharge status: Home / Self Care | Payer: Self-pay | Source: Home / Self Care | Attending: Surgery

## 2019-04-24 ENCOUNTER — Other Ambulatory Visit: Payer: Self-pay

## 2019-04-24 DIAGNOSIS — X58XXXA Exposure to other specified factors, initial encounter: Secondary | ICD-10-CM | POA: Insufficient documentation

## 2019-04-24 DIAGNOSIS — Z7982 Long term (current) use of aspirin: Secondary | ICD-10-CM | POA: Insufficient documentation

## 2019-04-24 DIAGNOSIS — Z79899 Other long term (current) drug therapy: Secondary | ICD-10-CM | POA: Insufficient documentation

## 2019-04-24 DIAGNOSIS — S83231A Complex tear of medial meniscus, current injury, right knee, initial encounter: Secondary | ICD-10-CM | POA: Insufficient documentation

## 2019-04-24 DIAGNOSIS — Z882 Allergy status to sulfonamides status: Secondary | ICD-10-CM | POA: Diagnosis not present

## 2019-04-24 DIAGNOSIS — F17220 Nicotine dependence, chewing tobacco, uncomplicated: Secondary | ICD-10-CM | POA: Diagnosis not present

## 2019-04-24 DIAGNOSIS — M1711 Unilateral primary osteoarthritis, right knee: Secondary | ICD-10-CM | POA: Diagnosis not present

## 2019-04-24 DIAGNOSIS — I10 Essential (primary) hypertension: Secondary | ICD-10-CM | POA: Insufficient documentation

## 2019-04-24 DIAGNOSIS — G4730 Sleep apnea, unspecified: Secondary | ICD-10-CM | POA: Insufficient documentation

## 2019-04-24 HISTORY — PX: KNEE ARTHROSCOPY WITH MEDIAL MENISECTOMY: SHX5651

## 2019-04-24 SURGERY — ARTHROSCOPY, KNEE, WITH MEDIAL MENISCECTOMY
Anesthesia: General | Site: Knee | Laterality: Right

## 2019-04-24 MED ORDER — BUPIVACAINE-EPINEPHRINE (PF) 0.5% -1:200000 IJ SOLN
INTRAMUSCULAR | Status: DC | PRN
  Administered 2019-04-24 (×2): 30 mL via PERINEURAL

## 2019-04-24 MED ORDER — FENTANYL CITRATE (PF) 100 MCG/2ML IJ SOLN
INTRAMUSCULAR | Status: DC | PRN
  Administered 2019-04-24 (×2): 50 ug via INTRAVENOUS

## 2019-04-24 MED ORDER — CEFAZOLIN SODIUM-DEXTROSE 2-4 GM/100ML-% IV SOLN
INTRAVENOUS | Status: AC
  Filled 2019-04-24: qty 100

## 2019-04-24 MED ORDER — OXYCODONE HCL 5 MG PO TABS
ORAL_TABLET | ORAL | Status: AC
  Administered 2019-04-24: 15:00:00 5 mg via ORAL
  Filled 2019-04-24: qty 1

## 2019-04-24 MED ORDER — PROPOFOL 10 MG/ML IV BOLUS
INTRAVENOUS | Status: AC
  Filled 2019-04-24: qty 20

## 2019-04-24 MED ORDER — DEXAMETHASONE SODIUM PHOSPHATE 10 MG/ML IJ SOLN
INTRAMUSCULAR | Status: DC | PRN
  Administered 2019-04-24: 10 mg via INTRAVENOUS

## 2019-04-24 MED ORDER — LACTATED RINGERS IV SOLN
INTRAVENOUS | Status: DC
  Administered 2019-04-24: 11:00:00 via INTRAVENOUS

## 2019-04-24 MED ORDER — FAMOTIDINE 20 MG PO TABS
20.0000 mg | ORAL_TABLET | Freq: Once | ORAL | Status: AC
  Administered 2019-04-24: 20 mg via ORAL

## 2019-04-24 MED ORDER — OXYCODONE HCL 5 MG PO TABS: 5.0000 mg | ORAL_TABLET | ORAL | 0 refills | Status: DC | PRN

## 2019-04-24 MED ORDER — ONDANSETRON HCL 4 MG/2ML IJ SOLN: 4.0000 mg | Freq: Once | INTRAMUSCULAR | Status: DC | PRN

## 2019-04-24 MED ORDER — LIDOCAINE HCL (CARDIAC) PF 100 MG/5ML IV SOSY
PREFILLED_SYRINGE | INTRAVENOUS | Status: DC | PRN
  Administered 2019-04-24: 100 mg via INTRAVENOUS

## 2019-04-24 MED ORDER — FENTANYL CITRATE (PF) 100 MCG/2ML IJ SOLN
25.0000 ug | INTRAMUSCULAR | Status: DC | PRN
  Administered 2019-04-24 (×3): 25 ug via INTRAVENOUS

## 2019-04-24 MED ORDER — FENTANYL CITRATE (PF) 100 MCG/2ML IJ SOLN
INTRAMUSCULAR | Status: AC
  Administered 2019-04-24: 15:00:00 25 ug via INTRAVENOUS
  Filled 2019-04-24: qty 2

## 2019-04-24 MED ORDER — PROPOFOL 10 MG/ML IV BOLUS
INTRAVENOUS | Status: DC | PRN
  Administered 2019-04-24: 150 mg via INTRAVENOUS

## 2019-04-24 MED ORDER — LIDOCAINE HCL (PF) 2 % IJ SOLN
INTRAMUSCULAR | Status: AC
  Filled 2019-04-24: qty 10

## 2019-04-24 MED ORDER — FENTANYL CITRATE (PF) 100 MCG/2ML IJ SOLN
INTRAMUSCULAR | Status: AC
  Filled 2019-04-24: qty 2

## 2019-04-24 MED ORDER — OXYCODONE HCL 5 MG/5ML PO SOLN: 5.0000 mg | Freq: Once | ORAL | Status: AC | PRN

## 2019-04-24 MED ORDER — BUPIVACAINE-EPINEPHRINE (PF) 0.5% -1:200000 IJ SOLN
INTRAMUSCULAR | Status: AC
  Filled 2019-04-24: qty 30

## 2019-04-24 MED ORDER — SEVOFLURANE IN SOLN
RESPIRATORY_TRACT | Status: AC
  Filled 2019-04-24: qty 250

## 2019-04-24 MED ORDER — LIDOCAINE HCL 1 % IJ SOLN
INTRAMUSCULAR | Status: DC | PRN
  Administered 2019-04-24: 30 mL

## 2019-04-24 MED ORDER — ONDANSETRON HCL 4 MG/2ML IJ SOLN: 4.0000 mg | Freq: Four times a day (QID) | INTRAMUSCULAR | Status: DC | PRN

## 2019-04-24 MED ORDER — MIDAZOLAM HCL 2 MG/2ML IJ SOLN
INTRAMUSCULAR | Status: AC
  Filled 2019-04-24: qty 2

## 2019-04-24 MED ORDER — OXYCODONE HCL 5 MG PO TABS
5.0000 mg | ORAL_TABLET | Freq: Once | ORAL | Status: AC | PRN
  Administered 2019-04-24: 5 mg via ORAL

## 2019-04-24 MED ORDER — METOCLOPRAMIDE HCL 10 MG PO TABS: 5.0000 mg | ORAL_TABLET | Freq: Three times a day (TID) | ORAL | Status: DC | PRN

## 2019-04-24 MED ORDER — OXYCODONE HCL 5 MG PO TABS: 5.0000 mg | ORAL_TABLET | ORAL | Status: DC | PRN

## 2019-04-24 MED ORDER — FAMOTIDINE 20 MG PO TABS
ORAL_TABLET | ORAL | Status: AC
  Administered 2019-04-24: 20 mg via ORAL
  Filled 2019-04-24: qty 1

## 2019-04-24 MED ORDER — LIDOCAINE HCL (PF) 1 % IJ SOLN
INTRAMUSCULAR | Status: AC
  Filled 2019-04-24: qty 30

## 2019-04-24 MED ORDER — METOCLOPRAMIDE HCL 5 MG/ML IJ SOLN: 5.0000 mg | Freq: Three times a day (TID) | INTRAMUSCULAR | Status: DC | PRN

## 2019-04-24 MED ORDER — POTASSIUM CHLORIDE IN NACL 20-0.9 MEQ/L-% IV SOLN: INTRAVENOUS | Status: DC

## 2019-04-24 MED ORDER — ONDANSETRON HCL 4 MG PO TABS: 4.0000 mg | ORAL_TABLET | Freq: Four times a day (QID) | ORAL | Status: DC | PRN

## 2019-04-24 SURGICAL SUPPLY — 49 items
"PENCIL ELECTRO HAND CTR " (MISCELLANEOUS) ×1 IMPLANT
APL PRP STRL LF DISP 70% ISPRP (MISCELLANEOUS) ×1
BAG COUNTER SPONGE EZ (MISCELLANEOUS) IMPLANT
BAG SPNG 4X4 CLR HAZ (MISCELLANEOUS)
BLADE FULL RADIUS 3.5 (BLADE) ×3 IMPLANT
BLADE SHAVER 4.5X7 STR FR (MISCELLANEOUS) ×3 IMPLANT
BNDG ELASTIC 6X5.8 VLCR STR LF (GAUZE/BANDAGES/DRESSINGS) ×3 IMPLANT
CARTRIDGE SUT 2-0 NONSTITCH (Anchor) ×8 IMPLANT
CHLORAPREP W/TINT 26 (MISCELLANEOUS) ×3 IMPLANT
COUNTER SPONGE BAG EZ (MISCELLANEOUS)
COVER WAND RF STERILE (DRAPES) ×3 IMPLANT
CUFF TOURN SGL QUICK 24 (TOURNIQUET CUFF)
CUFF TOURN SGL QUICK 30 (TOURNIQUET CUFF) ×3
CUFF TRNQT CYL 24X4X16.5-23 (TOURNIQUET CUFF) IMPLANT
CUFF TRNQT CYL 30X4X21-28X (TOURNIQUET CUFF) IMPLANT
DRAPE SPLIT 6X30 W/TAPE (DRAPES) ×3 IMPLANT
ELECT REM PT RETURN 9FT ADLT (ELECTROSURGICAL) ×3
ELECTRODE REM PT RTRN 9FT ADLT (ELECTROSURGICAL) ×1 IMPLANT
FASTFIX NDL DEL SYS 360 CVD (Miscellaneous) ×6 IMPLANT
GAUZE SPONGE 4X4 12PLY STRL (GAUZE/BANDAGES/DRESSINGS) ×3 IMPLANT
GLOVE BIO SURGEON STRL SZ8 (GLOVE) ×6 IMPLANT
GLOVE BIOGEL M 7.0 STRL (GLOVE) ×6 IMPLANT
GLOVE BIOGEL PI IND STRL 7.5 (GLOVE) ×1 IMPLANT
GLOVE BIOGEL PI INDICATOR 7.5 (GLOVE) ×2
GLOVE INDICATOR 8.0 STRL GRN (GLOVE) ×3 IMPLANT
GOWN STRL REUS W/ TWL LRG LVL3 (GOWN DISPOSABLE) ×1 IMPLANT
GOWN STRL REUS W/ TWL XL LVL3 (GOWN DISPOSABLE) ×2 IMPLANT
GOWN STRL REUS W/TWL LRG LVL3 (GOWN DISPOSABLE) ×3
GOWN STRL REUS W/TWL XL LVL3 (GOWN DISPOSABLE) ×6
IV LACTATED RINGER IRRG 3000ML (IV SOLUTION) ×3
IV LR IRRIG 3000ML ARTHROMATIC (IV SOLUTION) ×1 IMPLANT
KIT TURNOVER KIT A (KITS) ×3 IMPLANT
MANAGER SUT NOVOCUT (CUTTER) ×2 IMPLANT
MANIFOLD NEPTUNE II (INSTRUMENTS) ×3 IMPLANT
MENISCAL REPAIR SYSTEM SZ 0 (Anchor) ×3 IMPLANT
NDL HYPO 21X1.5 SAFETY (NEEDLE) ×1 IMPLANT
NEEDLE HYPO 21X1.5 SAFETY (NEEDLE) ×3 IMPLANT
NOVOSTICH PRO MENISCAL 2-0 (Miscellaneous) ×3 IMPLANT
PACK ARTHROSCOPY KNEE (MISCELLANEOUS) ×3 IMPLANT
PENCIL ELECTRO HAND CTR (MISCELLANEOUS) ×3 IMPLANT
PUSHER KNOT ARTHRO STRT FASTFI (MISCELLANEOUS) ×2 IMPLANT
SUT PROLENE 4 0 PS 2 18 (SUTURE) ×3 IMPLANT
SUT TICRON COATED BLUE 2 0 30 (SUTURE) IMPLANT
SYR 50ML LL SCALE MARK (SYRINGE) ×3 IMPLANT
SYSTEM NDL DEL FSTFX  360 CVD (Miscellaneous) IMPLANT
SYSTEM NVSTCH PRO MENISCAL 2-0 (Miscellaneous) IMPLANT
SYSTEM REPAIR MENISCAL SZ 0 (Anchor) IMPLANT
TUBING ARTHRO INFLOW-ONLY STRL (TUBING) ×3 IMPLANT
WAND WEREWOLF FLOW 90D (MISCELLANEOUS) ×3 IMPLANT

## 2019-04-24 NOTE — Discharge Instructions (Addendum)
Orthopedic discharge instructions: Keep dressing dry and intact.  May shower after dressing changed on post-op day #4 (Sunday).  Cover sutures with Band-Aids after drying off. Apply ice frequently to knee. Take ibuprofen 800 mg TID with meals for 7-10 days, then as necessary. Take oxycodone as prescribed when needed.  May supplement with ES Tylenol if necessary. No weight-bearing on right leg.  Use crutches for ambulation. Keep brace on and locked in extension during ambulation.  May unlock brace for comfort when lying down. Follow-up in 10-14 days or as scheduled.  AMBULATORY SURGERY  DISCHARGE INSTRUCTIONS   1) The drugs that you were given will stay in your system until tomorrow so for the next 24 hours you should not:  A) Drive an automobile B) Make any legal decisions C) Drink any alcoholic beverage   2) You may resume regular meals tomorrow.  Today it is better to start with liquids and gradually work up to solid foods.  You may eat anything you prefer, but it is better to start with liquids, then soup and crackers, and gradually work up to solid foods.   3) Please notify your doctor immediately if you have any unusual bleeding, trouble breathing, redness and pain at the surgery site, drainage, fever, or pain not relieved by medication.    4) Additional Instructions:        Please contact your physician with any problems or Same Day Surgery at [phone], Monday through Friday 6 am to 4 pm, or [place] at [facility] number at [phone].

## 2019-04-24 NOTE — Transfer of Care (Signed)
Immediate Anesthesia Transfer of Care Note  Patient: Rodney Brooks.  Procedure(s) Performed: KNEE ARTHROSCOPY WITH DEBRIDEMENT AND REPAIR VS. PARTIAL MEDIAL MENISECTOMY (Right Knee)  Patient Location: PACU  Anesthesia Type:General  Level of Consciousness: awake, alert  and oriented  Airway & Oxygen Therapy: Patient Spontanous Breathing and Patient connected to face mask oxygen  Post-op Assessment: Report given to RN and Post -op Vital signs reviewed and stable  Post vital signs: Reviewed and stable  Last Vitals:  Vitals Value Taken Time  BP 159/98 04/24/19 1434  Temp 36.8 C 04/24/19 1434  Pulse 95 04/24/19 1436  Resp 42 04/24/19 1436  SpO2 100 % 04/24/19 1436  Vitals shown include unvalidated device data.  Last Pain:  Vitals:   04/24/19 1039  TempSrc: Oral  PainSc: 1          Complications: No apparent anesthesia complications

## 2019-04-24 NOTE — Op Note (Signed)
04/24/2019  2:39 PM  Patient:   Rodney A Lantry Jr.  Pre-Op Diagnosis:   Complex medial meniscus tear with underlying degenerative joint disease, right knee.  Postoperative diagnosis:   Same  Procedure:   Arthroscopic medial meniscus repair with abrasion chondroplasty of grade 3 chondromalacial changes involving the medial femoral condyle, medial tibial plateau, lateral tibial plateau, and patella, right knee.  Surgeon:   Pascal Lux, M.D.  Anesthesia:   General LMA.  Findings:   As above. The anterior and posterior cruciate ligaments both were in satisfactory condition, as was the lateral meniscus.  Complications:   None.  EBL:   5 cc.  Total fluids:   800 cc of crystalloid.  Tourniquet time:   None  Drains:   None  Closure:   4-0 Prolene interrupted sutures.  Brief clinical note:   The patient is a 53 year old male who sustained the above-noted injury about 8 weeks ago when he felt a sharp pop in the posterior medial aspect of his right knee while trying to get up into his truck. His symptoms have persisted despite medications, activity modification, etc. His history and examination are consistent with a medial meniscus tear confirmed by MRI scan. The patient presents at this time for arthroscopy, debridement, and repair versus partial medial meniscectomy.  Procedure:   The patient was brought into the operating room and lain in the supine position. After adequate general laryngeal mask anesthesia was obtained, a timeout was performed to verify the appropriate side. The patient's right knee was injected sterilely using a solution of 30 cc of 1% lidocaine and 30 cc of 0.5% Sensorcaine with epinephrine. The right lower extremity was prepped with ChloraPrep solution before being draped sterilely. Preoperative antibiotics were administered. The expected portal sites were injected with 0.5% Sensorcaine with epinephrine before the camera was placed in the anterolateral portal and  instrumentation performed through the anteromedial portal.   The knee was sequentially examined beginning in the suprapatellar pouch, then progressing to the patellofemoral space, the medial gutter and compartment, the notch, and finally the lateral compartment and gutter. The findings were as described above. Abundant reactive synovial tissues anteriorly were debrided using the full-radius resector in order to improve visualization. Areas of grade II-III chondromalacia involving the weightbearing portions of the medial femoral condyle and the medial tibial plateau, as well as the anterior portion of the lateral tibial plateau all were debrided back to stable margins using the full-radius resector. In addition, there was an area of grade II-III chondromalacia involving the central ridge of the patella which also required debridement. The medial meniscus was carefully probed. There was a horizontal tear involving the posteromedial corner of the meniscus and a radial tear close to, but not at, the root of the medial meniscus. After several attempts, one suture was able to be passed across the radial tear using the [facility] Ceterix device. The horizontal tear was repaired using two [employer] FasT-Fix 360 devices. Subsequent probing of the repair demonstrated good stability. The instruments were removed from the joint after suctioning the excess fluid.   The portal sites were closed using 4-0 Prolene interrupted sutures before a sterile bulky dressing was applied to the knee. The patient was then placed into a hinged knee brace with the hinges set at 0 to 90 degrees, but locked in extension, before he was awakened, extubated, and returned to the recovery room in satisfactory condition after tolerating the procedure well.

## 2019-04-24 NOTE — Anesthesia Preprocedure Evaluation (Signed)
Anesthesia Evaluation  Patient identified by MRN, date of birth, ID band Patient awake    Reviewed: Allergy & Precautions, NPO status , Patient's Chart, lab work & pertinent test results  History of Anesthesia Complications Negative for: history of anesthetic complications  Airway Mallampati: II       Dental   Pulmonary sleep apnea and Continuous Positive Airway Pressure Ventilation , neg COPD, Current Smoker,           Cardiovascular hypertension, Pt. on medications (-) Past MI and (-) CHF (-) dysrhythmias (-) Valvular Problems/Murmurs     Neuro/Psych neg Seizures    GI/Hepatic Neg liver ROS, neg GERD  ,  Endo/Other  neg diabetes  Renal/GU negative Renal ROS     Musculoskeletal   Abdominal   Peds  Hematology   Anesthesia Other Findings   Reproductive/Obstetrics                             Anesthesia Physical Anesthesia Plan  ASA: II  Anesthesia Plan: General   Post-op Pain Management:    Induction: Intravenous  PONV Risk Score and Plan: 1  Airway Management Planned: LMA  Additional Equipment:   Intra-op Plan:   Post-operative Plan:   Informed Consent: I have reviewed the patients History and Physical, chart, labs and discussed the procedure including the risks, benefits and alternatives for the proposed anesthesia with the patient or authorized representative who has indicated his/her understanding and acceptance.       Plan Discussed with:   Anesthesia Plan Comments:         Anesthesia Quick Evaluation

## 2019-04-24 NOTE — Anesthesia Procedure Notes (Signed)
Procedure Name: Intubation Performed by: Philbert Riser, CRNA Pre-anesthesia Checklist: Patient identified, Emergency Drugs available, Suction available, Patient being monitored and Timeout performed Patient Re-evaluated:Patient Re-evaluated prior to induction Oxygen Delivery Method: Circle system utilized and Simple face mask Preoxygenation: Pre-oxygenation with 100% oxygen Induction Type: IV induction Ventilation: Mask ventilation without difficulty LMA Size: 4.5 Placement Confirmation: positive ETCO2 and breath sounds checked- equal and bilateral Dental Injury: Teeth and Oropharynx as per pre-operative assessment

## 2019-04-24 NOTE — H&P (Signed)
Paper H&P to be scanned into permanent record. H&P reviewed and patient re-examined. No changes. 

## 2019-04-24 NOTE — Anesthesia Post-op Follow-up Note (Signed)
Anesthesia QCDR form completed.        

## 2019-04-24 NOTE — Anesthesia Postprocedure Evaluation (Signed)
Anesthesia Post Note  Patient: Rodney Brooks.  Procedure(s) Performed: KNEE ARTHROSCOPY WITH DEBRIDEMENT AND REPAIR VS. PARTIAL MEDIAL MENISECTOMY (Right Knee)  Patient location during evaluation: PACU Anesthesia Type: General Level of consciousness: awake and alert and oriented Pain management: pain level controlled Vital Signs Assessment: post-procedure vital signs reviewed and stable Respiratory status: spontaneous breathing, nonlabored ventilation and respiratory function stable Cardiovascular status: blood pressure returned to baseline and stable Postop Assessment: no signs of nausea or vomiting Anesthetic complications: no     Last Vitals:  Vitals:   04/24/19 1505 04/24/19 1511  BP:  (!) 159/97  Pulse: 95 93  Resp: 13 16  Temp:  36.8 C  SpO2: 96% 97%    Last Pain:  Vitals:   04/24/19 1505  TempSrc:   PainSc: 5                  Abelina Ketron

## 2019-04-25 ENCOUNTER — Encounter: Payer: Self-pay | Admitting: Surgery

## 2019-04-25 NOTE — Addendum Note (Signed)
Addendum  created 04/25/19 0909 by Hedda Slade, CRNA   Charge Capture section accepted

## 2020-03-30 ENCOUNTER — Other Ambulatory Visit (HOSPITAL_COMMUNITY): Payer: Self-pay | Admitting: Family

## 2020-03-30 ENCOUNTER — Other Ambulatory Visit: Payer: Self-pay | Admitting: Family

## 2020-03-30 DIAGNOSIS — N5089 Other specified disorders of the male genital organs: Secondary | ICD-10-CM

## 2020-04-02 ENCOUNTER — Other Ambulatory Visit: Payer: Self-pay

## 2020-04-02 ENCOUNTER — Ambulatory Visit (HOSPITAL_COMMUNITY)
Admission: RE | Admit: 2020-04-02 | Discharge: 2020-04-02 | Disposition: A | Discharge status: Home / Self Care | Payer: BC Managed Care – PPO | Source: Ambulatory Visit | Attending: Family | Admitting: Family

## 2020-04-02 DIAGNOSIS — N5089 Other specified disorders of the male genital organs: Secondary | ICD-10-CM

## 2020-05-06 ENCOUNTER — Other Ambulatory Visit: Payer: Self-pay | Admitting: Student

## 2020-05-06 DIAGNOSIS — M25562 Pain in left knee: Secondary | ICD-10-CM

## 2020-05-25 ENCOUNTER — Other Ambulatory Visit: Payer: Self-pay

## 2020-05-25 ENCOUNTER — Ambulatory Visit
Admission: RE | Admit: 2020-05-25 | Discharge: 2020-05-25 | Disposition: A | Discharge status: Home / Self Care | Payer: BC Managed Care – PPO | Source: Ambulatory Visit | Attending: Student | Admitting: Student

## 2020-05-25 DIAGNOSIS — M25562 Pain in left knee: Secondary | ICD-10-CM | POA: Diagnosis not present

## 2020-06-14 ENCOUNTER — Ambulatory Visit: Payer: BC Managed Care – PPO | Admitting: Internal Medicine

## 2020-07-24 IMAGING — US US SCROTUM W/ DOPPLER COMPLETE
1 series · 14 of 25 positions shown · non-contrast
Comparison: None.

CLINICAL DATA: Right scrotal swelling

EXAM:
SCROTAL ULTRASOUND
DOPPLER ULTRASOUND OF THE TESTICLES
TECHNIQUE: Complete ultrasound examination of the testicles, epididymis, and
other scrotal structures was performed. Color and spectral Doppler
ultrasound were also utilized to evaluate blood flow to the
testicles.

[Series 1: us scrotum w/ doppler complete · 0.06mm/px · 14 of 119 slices shown]
[im 1/119]
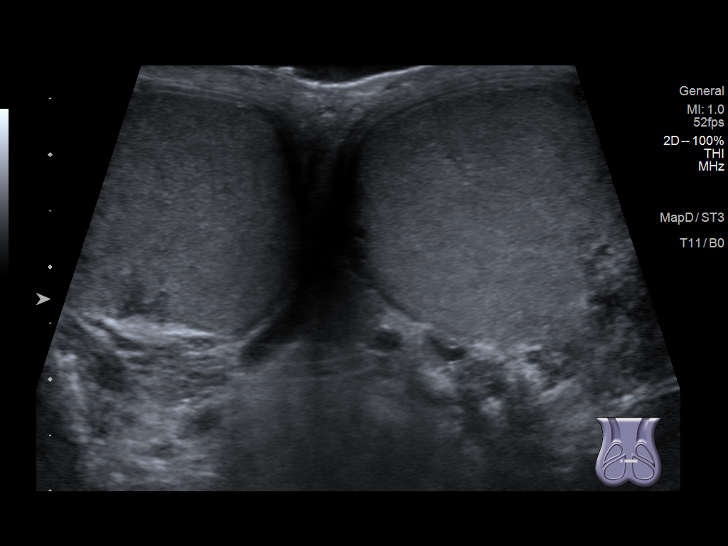
[im 10/119]
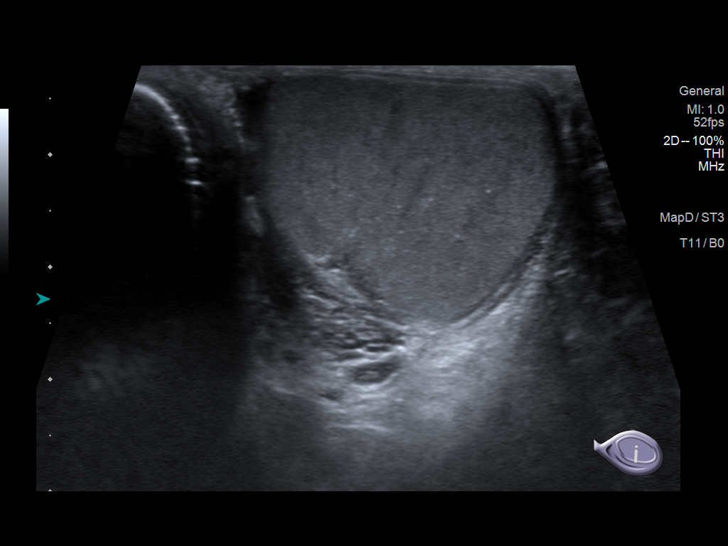
[im 20/119]
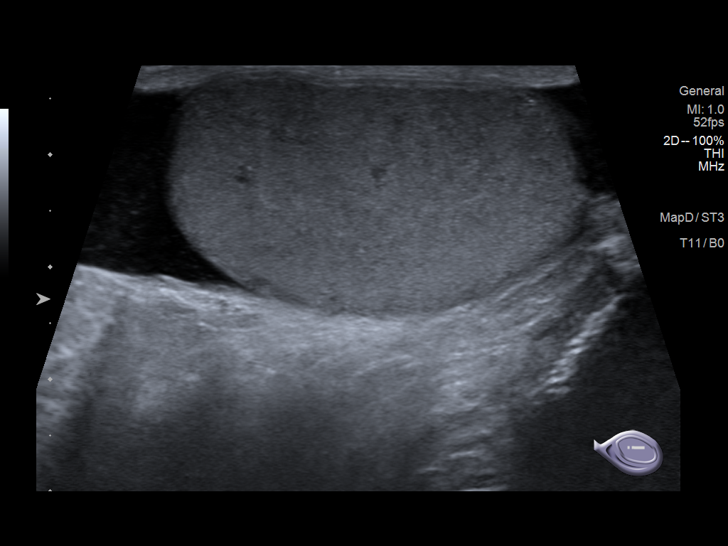
[im 30/119]
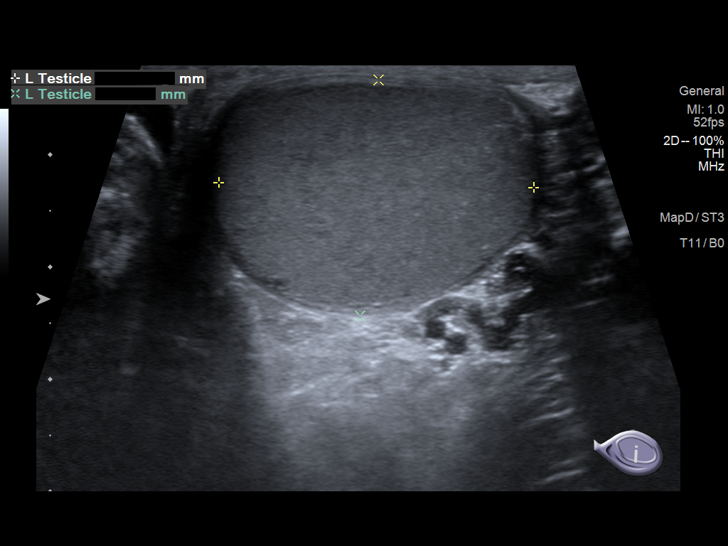
[im 40/119]
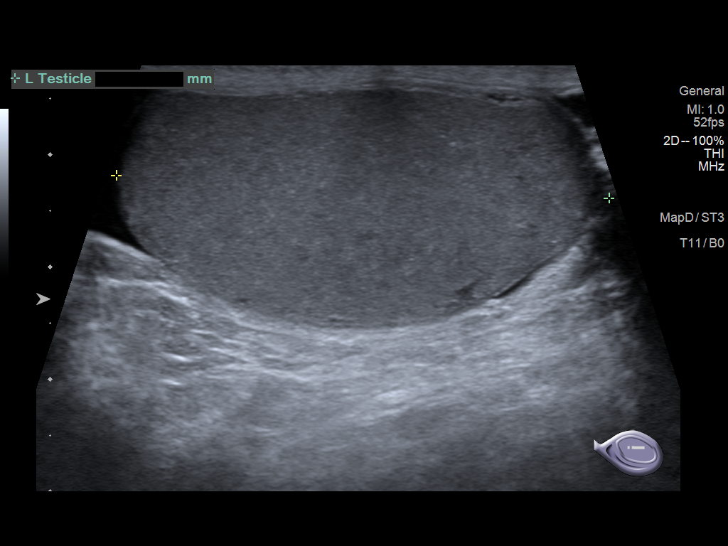
[im 45/119]
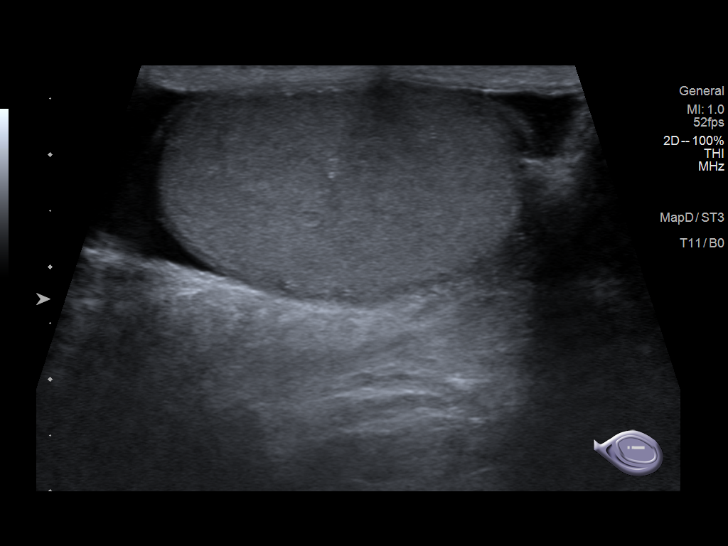
[im 55/119]
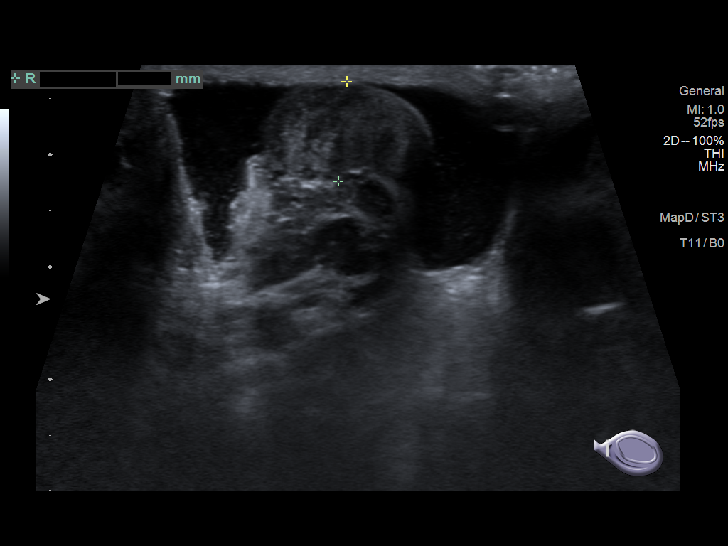
[im 64/119]
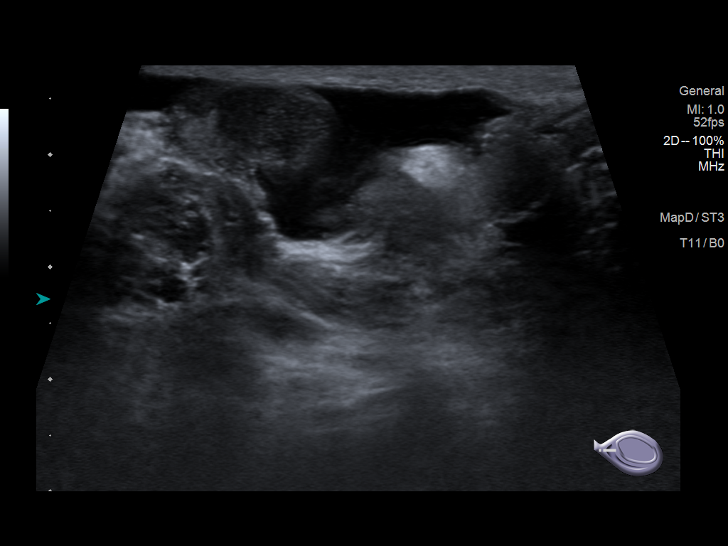
[im 74/119]
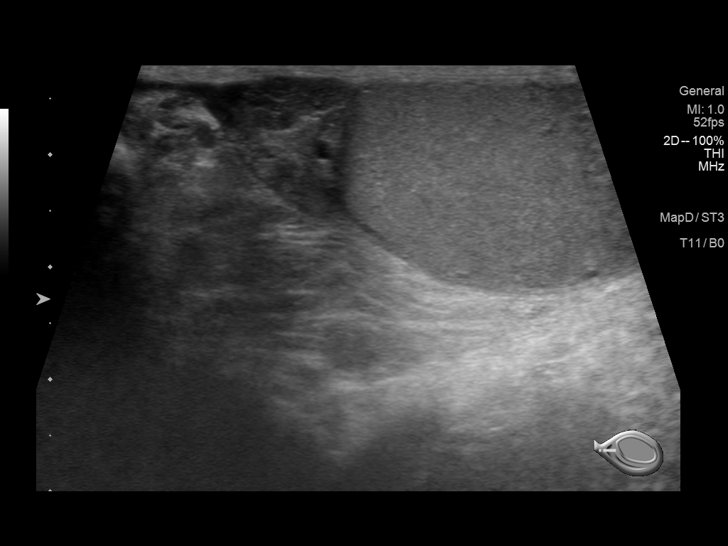
[im 79/119]
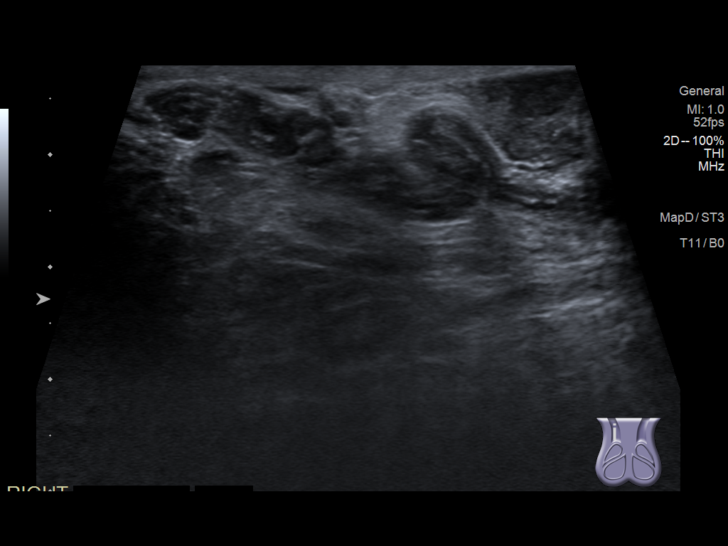
[im 89/119]
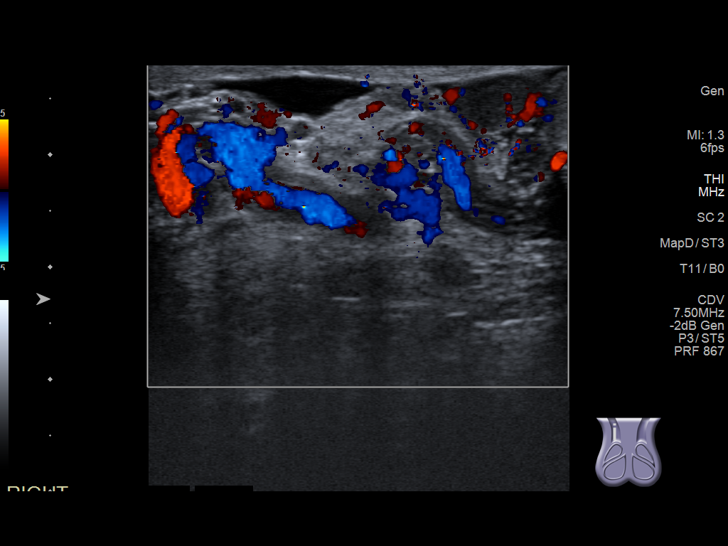
[im 99/119]
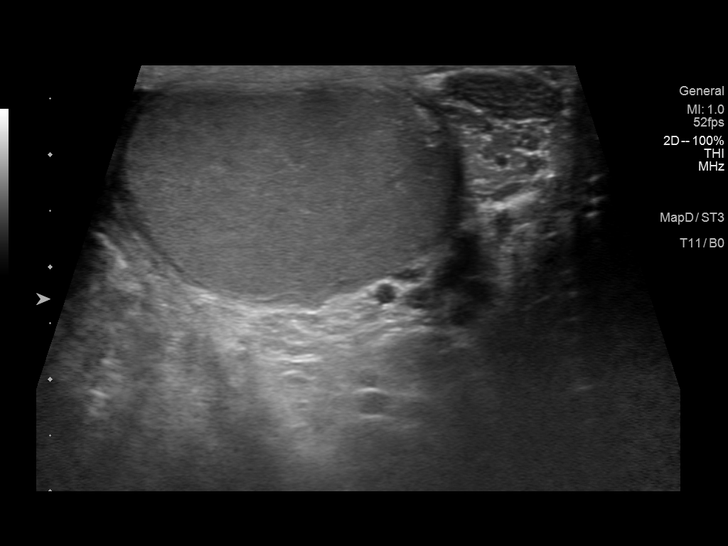
[im 109/119]
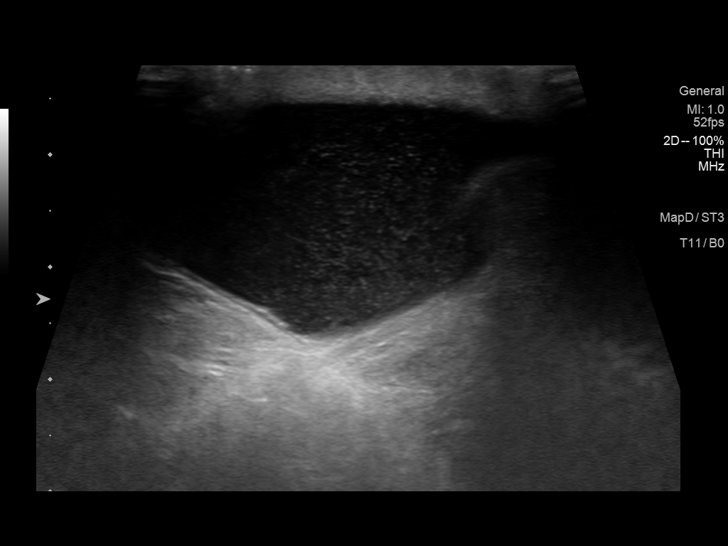
[im 119/119]
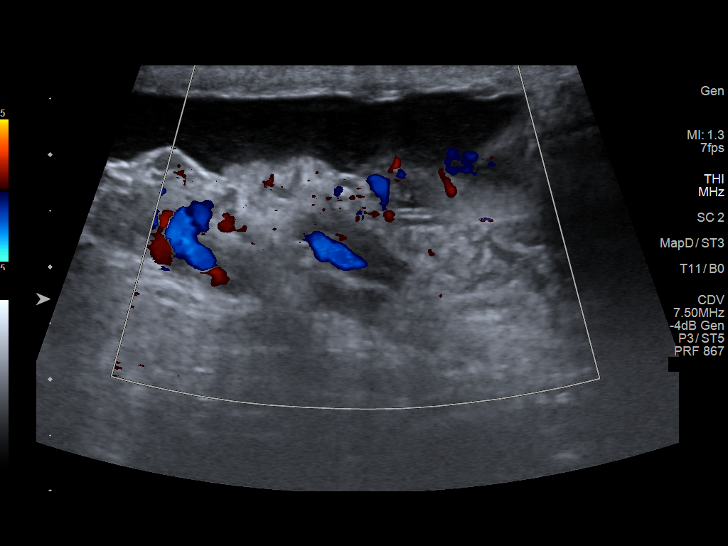

[14 of 25 positions shown; findings below may reference images not displayed]

FINDINGS: Right testicle

Measurements: 4.4 x 2.2 x 2.6 cm. No mass or microlithiasis
visualized.

Left testicle

Measurements: 4.4 x 2.1 x 2.8 cm. No mass or microlithiasis
visualized.

Right epididymis: Enlarged right epididymis with hypervascularity
compatible with epididymitis.

Left epididymis:  Normal in size and appearance.

Hydrocele:  Moderate right hydrocele.

Varicocele:  Mild left varicocele.

Pulsed Doppler interrogation of both testes demonstrates normal low
resistance arterial and venous waveforms bilaterally.
IMPRESSION: Enlarged hyperemic right epididymis compatible with epididymitis.

Left varicocele.

Right hydrocele.

No evidence of testicular torsion.

## 2020-08-12 ENCOUNTER — Other Ambulatory Visit: Payer: Self-pay | Admitting: Surgery

## 2020-08-19 NOTE — Progress Notes (Signed)
Patient was no-show for appointment.  The office staff will contact the patient for rescheduling follow-up. 

## 2020-08-31 ENCOUNTER — Other Ambulatory Visit: Payer: Self-pay

## 2020-08-31 ENCOUNTER — Encounter
Admission: RE | Admit: 2020-08-31 | Discharge: 2020-08-31 | Disposition: A | Discharge status: Home / Self Care | Payer: BC Managed Care – PPO | Source: Ambulatory Visit | Attending: Surgery | Admitting: Surgery

## 2020-08-31 DIAGNOSIS — Z01810 Encounter for preprocedural cardiovascular examination: Secondary | ICD-10-CM

## 2020-08-31 DIAGNOSIS — Z01818 Encounter for other preprocedural examination: Secondary | ICD-10-CM | POA: Diagnosis not present

## 2020-08-31 HISTORY — DX: Chronic kidney disease, unspecified: N18.9

## 2020-08-31 HISTORY — DX: Unspecified osteoarthritis, unspecified site: M19.90

## 2020-08-31 HISTORY — DX: Cardiac arrhythmia, unspecified: I49.9

## 2020-08-31 LAB — URINALYSIS, ROUTINE W REFLEX MICROSCOPIC
Bacteria, UA: NONE SEEN
Bilirubin Urine: NEGATIVE
Glucose, UA: NEGATIVE mg/dL
Ketones, ur: NEGATIVE mg/dL
Leukocytes,Ua: NEGATIVE
Nitrite: NEGATIVE
Protein, ur: 30 mg/dL — AB
Specific Gravity, Urine: 1.015 (ref 1.005–1.030)
Squamous Epithelial / HPF: NONE SEEN (ref 0–5)
pH: 5 (ref 5.0–8.0)

## 2020-08-31 LAB — COMPREHENSIVE METABOLIC PANEL
ALT: 36 U/L (ref 0–44)
AST: 29 U/L (ref 15–41)
Albumin: 4.2 g/dL (ref 3.5–5.0)
Alkaline Phosphatase: 35 U/L — ABNORMAL LOW (ref 38–126)
Anion gap: 12 (ref 5–15)
BUN: 23 mg/dL — ABNORMAL HIGH (ref 6–20)
CO2: 27 mmol/L (ref 22–32)
Calcium: 9.5 mg/dL (ref 8.9–10.3)
Chloride: 96 mmol/L — ABNORMAL LOW (ref 98–111)
Creatinine, Ser: 1.28 mg/dL — ABNORMAL HIGH (ref 0.61–1.24)
GFR, Estimated: >60 mL/min (ref >60)
Glucose, Bld: 98 mg/dL (ref 70–99)
Potassium: 3.8 mmol/L (ref 3.5–5.1)
Sodium: 135 mmol/L (ref 135–145)
Total Bilirubin: 1.1 mg/dL (ref 0.3–1.2)
Total Protein: 7.5 g/dL (ref 6.5–8.1)

## 2020-08-31 LAB — CBC WITH DIFFERENTIAL/PLATELET
Abs Immature Granulocytes: 0.02 10*3/uL (ref 0.00–0.07)
Basophils Absolute: 0 10*3/uL (ref 0.0–0.1)
Basophils Relative: 0 %
Eosinophils Absolute: 0.2 10*3/uL (ref 0.0–0.5)
Eosinophils Relative: 2 %
HCT: 44.4 % (ref 39.0–52.0)
Hemoglobin: 15.2 g/dL (ref 13.0–17.0)
Immature Granulocytes: 0 %
Lymphocytes Relative: 19 %
Lymphs Abs: 1.7 10*3/uL (ref 0.7–4.0)
MCH: 33 pg (ref 26.0–34.0)
MCHC: 34.2 g/dL (ref 30.0–36.0)
MCV: 96.3 fL (ref 80.0–100.0)
Monocytes Absolute: 0.7 10*3/uL (ref 0.1–1.0)
Monocytes Relative: 9 %
Neutro Abs: 6 10*3/uL (ref 1.7–7.7)
Neutrophils Relative %: 70 %
Platelets: 272 10*3/uL (ref 150–400)
RBC: 4.61 MIL/uL (ref 4.22–5.81)
RDW: 12.2 % (ref 11.5–15.5)
WBC: 8.6 10*3/uL (ref 4.0–10.5)
nRBC: 0 % (ref 0.0–0.2)

## 2020-08-31 LAB — TYPE AND SCREEN
ABO/RH(D): A POS
Antibody Screen: NEGATIVE

## 2020-08-31 LAB — SURGICAL PCR SCREEN
MRSA, PCR: NEGATIVE
Staphylococcus aureus: NEGATIVE

## 2020-08-31 NOTE — Patient Instructions (Addendum)
Your procedure is scheduled on: Tues 12/7 Report to Registration in the [place]   Then to Day surgery on the 2nd floor. To find out your arrival time please call [phone] between 1PM - 3PM on Mon 12/6.  Remember: Instructions that are not followed completely may result in serious medical risk,  up to and including death, or upon the discretion of your surgeon and anesthesiologist your  surgery may need to be rescheduled.     _X__ 1. Do not eat food after midnight the night before your procedure.                 No chewing gum or hard candies. You may drink clear liquids up to 2 hours                 before you are scheduled to arrive for your surgery- DO not drink clear                 liquids within 2 hours of the start of your surgery.                 Clear Liquids include:  water, apple juice without pulp, clear Gatorade, G2 or                  Gatorade Zero (avoid Red/Purple/Blue), Black Coffee or Tea (Do not add                 anything to coffee or tea). ___x__2.   Complete the "Ensure Clear Pre-surgery Clear Carbohydrate Drink" provided to you, 2 hours before arrival. **If you       are diabetic you will be provided with an alternative drink, Gatorade Zero or G2.  __X__2.  On the morning of surgery brush your teeth with toothpaste and water, you                may rinse your mouth with mouthwash if you wish.  Do not swallow any toothpaste of mouthwash.     _X__ 3.  No Alcohol for 24 hours before or after surgery.   _X__ 4.  Do Not Smoke or use e-cigarettes For 24 Hours Prior to Your Surgery.                 Do not use any chewable tobacco products for at least 6 hours prior to                 Surgery.  ___  5.  Do not use any recreational drugs (marijuana, cocaine, heroin, ecstasy, MDMA or other)                For at least one week prior to your surgery.  Combination of these drugs with anesthesia                May have life threatening  results.  ____  6.  Bring all medications with you on the day of surgery if instructed.   _x___  7.  Notify your doctor if there is any change in your medical condition      (cold, fever, infections).     Do not wear jewelry,  Do not wear lotions,. You may wear deodorant. Do not shave 48 hours prior to surgery. Men may shave face and neck. Do not bring valuables to the hospital.    [facility] is not responsible for any belongings or valuables.  Contacts, dentures or bridgework may not be  worn into surgery. Leave your suitcase in the car. After surgery it may be brought to your room. For patients admitted to the hospital, discharge time is determined by your treatment team.   Patients discharged the day of surgery will not be allowed to drive home.   Make arrangements for someone to be with you for the first 24 hours of your Same Day Discharge.    Please read over the following fact sheets that you were given:   Incentive spirometer  __x__ Take these medicines the morning of surgery with A SIP OF WATER:    1. simvastatin (ZOCOR) 40 MG tablet  2.   3.   4.  5.  6.  ____ Fleet Enema (as directed)   __x__ Use CHG Soap (or wipes) as directed  ____ Use Benzoyl Peroxide Gel as instructed  ____ Use inhalers on the day of surgery  ____ Stop metformin 2 days prior to surgery    ____ Take 1/2 of usual insulin dose the night before surgery. No insulin the morning          of surgery.   ____ Stop Coumadin/Plavix/aspirin on   __x__ Stop Anti-inflammatories  No ibuprofen or aleve     May tylenol    ____ Stop supplements until after surgery.    __x__ Bring C-Pap to the hospital.    If you have any questions regarding your pre-procedure instructions,  Please call Pre-admit Testing at [place]

## 2020-09-03 ENCOUNTER — Other Ambulatory Visit
Admission: RE | Admit: 2020-09-03 | Discharge: 2020-09-03 | Disposition: A | Discharge status: Home / Self Care | Payer: BC Managed Care – PPO | Source: Ambulatory Visit | Attending: Surgery | Admitting: Surgery

## 2020-09-03 ENCOUNTER — Other Ambulatory Visit: Payer: Self-pay

## 2020-09-03 DIAGNOSIS — Z20822 Contact with and (suspected) exposure to covid-19: Secondary | ICD-10-CM | POA: Diagnosis not present

## 2020-09-03 DIAGNOSIS — Z01812 Encounter for preprocedural laboratory examination: Secondary | ICD-10-CM | POA: Insufficient documentation

## 2020-09-04 LAB — SARS CORONAVIRUS 2 (TAT 6-24 HRS): SARS Coronavirus 2: NEGATIVE

## 2020-09-07 ENCOUNTER — Encounter
Admission: RE | Disposition: A | Discharge status: Home / Self Care | Payer: Self-pay | Source: Home / Self Care | Attending: Surgery

## 2020-09-07 ENCOUNTER — Other Ambulatory Visit: Payer: Self-pay

## 2020-09-07 ENCOUNTER — Ambulatory Visit: Payer: BC Managed Care – PPO

## 2020-09-07 ENCOUNTER — Ambulatory Visit
Admission: RE | Admit: 2020-09-07 | Discharge: 2020-09-07 | Disposition: A | Discharge status: Home / Self Care | Payer: BC Managed Care – PPO | Attending: Surgery | Admitting: Surgery

## 2020-09-07 ENCOUNTER — Ambulatory Visit: Payer: BC Managed Care – PPO | Admitting: Anesthesiology

## 2020-09-07 ENCOUNTER — Encounter: Payer: Self-pay | Admitting: Surgery

## 2020-09-07 DIAGNOSIS — Z882 Allergy status to sulfonamides status: Secondary | ICD-10-CM | POA: Insufficient documentation

## 2020-09-07 DIAGNOSIS — Z96652 Presence of left artificial knee joint: Secondary | ICD-10-CM

## 2020-09-07 DIAGNOSIS — M1712 Unilateral primary osteoarthritis, left knee: Secondary | ICD-10-CM | POA: Insufficient documentation

## 2020-09-07 DIAGNOSIS — Z79899 Other long term (current) drug therapy: Secondary | ICD-10-CM | POA: Insufficient documentation

## 2020-09-07 DIAGNOSIS — S83232A Complex tear of medial meniscus, current injury, left knee, initial encounter: Secondary | ICD-10-CM | POA: Diagnosis not present

## 2020-09-07 DIAGNOSIS — W109XXA Fall (on) (from) unspecified stairs and steps, initial encounter: Secondary | ICD-10-CM | POA: Diagnosis not present

## 2020-09-07 HISTORY — PX: TOTAL KNEE ARTHROPLASTY: SHX125

## 2020-09-07 LAB — ABO/RH: ABO/RH(D): A POS

## 2020-09-07 SURGERY — ARTHROPLASTY, KNEE, TOTAL
Anesthesia: Choice | Site: Knee | Laterality: Left

## 2020-09-07 MED ORDER — ACETAMINOPHEN 10 MG/ML IV SOLN
INTRAVENOUS | Status: DC | PRN
  Administered 2020-09-07: 1000 mg via INTRAVENOUS

## 2020-09-07 MED ORDER — OXYCODONE HCL 5 MG PO TABS: 5.0000 mg | ORAL_TABLET | ORAL | Status: DC | PRN

## 2020-09-07 MED ORDER — SODIUM CHLORIDE 0.9 % IV SOLN: INTRAVENOUS | Status: DC

## 2020-09-07 MED ORDER — ACETAMINOPHEN 10 MG/ML IV SOLN
1000.0000 mg | Freq: Once | INTRAVENOUS | Status: DC | PRN
  Administered 2020-09-07: 1000 mg via INTRAVENOUS

## 2020-09-07 MED ORDER — PROPOFOL 500 MG/50ML IV EMUL
INTRAVENOUS | Status: DC | PRN
  Administered 2020-09-07: 50 ug/kg/min via INTRAVENOUS

## 2020-09-07 MED ORDER — CHLORHEXIDINE GLUCONATE 0.12 % MT SOLN: 15.0000 mL | Freq: Once | OROMUCOSAL | Status: AC

## 2020-09-07 MED ORDER — HYDROCODONE-ACETAMINOPHEN 5-325 MG PO TABS
1.0000 | ORAL_TABLET | Freq: Four times a day (QID) | ORAL | 0 refills | Status: DC | PRN
Start: 2020-09-07 — End: 2020-10-11

## 2020-09-07 MED ORDER — PROPOFOL 500 MG/50ML IV EMUL
INTRAVENOUS | Status: AC
  Filled 2020-09-07: qty 50

## 2020-09-07 MED ORDER — FAMOTIDINE 20 MG PO TABS: 20.0000 mg | ORAL_TABLET | Freq: Once | ORAL | Status: AC

## 2020-09-07 MED ORDER — MIDAZOLAM HCL 2 MG/2ML IJ SOLN
INTRAMUSCULAR | Status: AC
  Filled 2020-09-07: qty 2

## 2020-09-07 MED ORDER — SODIUM CHLORIDE 0.9 % IV BOLUS
250.0000 mL | Freq: Once | INTRAVENOUS | Status: AC
  Administered 2020-09-07: 250 mL via INTRAVENOUS

## 2020-09-07 MED ORDER — MIDAZOLAM HCL 5 MG/5ML IJ SOLN
INTRAMUSCULAR | Status: DC | PRN
  Administered 2020-09-07: 2 mg via INTRAVENOUS

## 2020-09-07 MED ORDER — BUPIVACAINE HCL (PF) 0.5 % IJ SOLN
INTRAMUSCULAR | Status: AC
  Filled 2020-09-07: qty 10

## 2020-09-07 MED ORDER — OXYCODONE HCL 5 MG PO TABS
ORAL_TABLET | ORAL | Status: AC
  Filled 2020-09-07: qty 1

## 2020-09-07 MED ORDER — ORAL CARE MOUTH RINSE: 15.0000 mL | Freq: Once | OROMUCOSAL | Status: AC

## 2020-09-07 MED ORDER — FENTANYL CITRATE (PF) 100 MCG/2ML IJ SOLN
INTRAMUSCULAR | Status: DC | PRN
  Administered 2020-09-07: 50 ug via INTRAVENOUS

## 2020-09-07 MED ORDER — CEFAZOLIN SODIUM-DEXTROSE 2-4 GM/100ML-% IV SOLN
2.0000 g | INTRAVENOUS | Status: AC
  Administered 2020-09-07: 2 g via INTRAVENOUS

## 2020-09-07 MED ORDER — PROPOFOL 10 MG/ML IV BOLUS
INTRAVENOUS | Status: AC
  Filled 2020-09-07: qty 20

## 2020-09-07 MED ORDER — ONDANSETRON HCL 4 MG PO TABS: 4.0000 mg | ORAL_TABLET | Freq: Four times a day (QID) | ORAL | Status: DC | PRN

## 2020-09-07 MED ORDER — LACTATED RINGERS IV SOLN: INTRAVENOUS | Status: DC

## 2020-09-07 MED ORDER — KETOROLAC TROMETHAMINE 15 MG/ML IJ SOLN
15.0000 mg | Freq: Four times a day (QID) | INTRAMUSCULAR | Status: DC
  Administered 2020-09-07: 15 mg via INTRAVENOUS

## 2020-09-07 MED ORDER — FENTANYL CITRATE (PF) 100 MCG/2ML IJ SOLN
INTRAMUSCULAR | Status: AC
  Filled 2020-09-07: qty 2

## 2020-09-07 MED ORDER — BUPIVACAINE HCL (PF) 0.5 % IJ SOLN
INTRAMUSCULAR | Status: DC | PRN
  Administered 2020-09-07: 2.9 mL

## 2020-09-07 MED ORDER — OXYCODONE HCL 5 MG PO TABS
5.0000 mg | ORAL_TABLET | ORAL | 0 refills | Status: DC | PRN
Start: 2020-09-07 — End: 2020-10-11

## 2020-09-07 MED ORDER — APIXABAN 2.5 MG PO TABS: 2.5000 mg | ORAL_TABLET | Freq: Two times a day (BID) | ORAL | 0 refills | Status: DC

## 2020-09-07 MED ORDER — KETOROLAC TROMETHAMINE 30 MG/ML IJ SOLN
INTRAMUSCULAR | Status: AC
  Filled 2020-09-07: qty 1

## 2020-09-07 MED ORDER — KETOROLAC TROMETHAMINE 30 MG/ML IJ SOLN
INTRAMUSCULAR | Status: AC
  Administered 2020-09-07: 30 mg via INTRAVENOUS
  Filled 2020-09-07: qty 1

## 2020-09-07 MED ORDER — FAMOTIDINE 20 MG PO TABS
ORAL_TABLET | ORAL | Status: AC
  Administered 2020-09-07: 20 mg via ORAL
  Filled 2020-09-07: qty 1

## 2020-09-07 MED ORDER — TRANEXAMIC ACID 1000 MG/10ML IV SOLN
INTRAVENOUS | Status: DC | PRN
  Administered 2020-09-07: 1000 mg via TOPICAL

## 2020-09-07 MED ORDER — METOCLOPRAMIDE HCL 10 MG PO TABS: 5.0000 mg | ORAL_TABLET | Freq: Three times a day (TID) | ORAL | Status: DC | PRN

## 2020-09-07 MED ORDER — ONDANSETRON HCL 4 MG/2ML IJ SOLN: 4.0000 mg | Freq: Once | INTRAMUSCULAR | Status: DC | PRN

## 2020-09-07 MED ORDER — CEFAZOLIN SODIUM-DEXTROSE 2-4 GM/100ML-% IV SOLN
INTRAVENOUS | Status: AC
  Administered 2020-09-07: 2 g via INTRAVENOUS
  Filled 2020-09-07: qty 100

## 2020-09-07 MED ORDER — ACETAMINOPHEN 10 MG/ML IV SOLN
INTRAVENOUS | Status: AC
  Filled 2020-09-07: qty 100

## 2020-09-07 MED ORDER — BUPIVACAINE-EPINEPHRINE (PF) 0.5% -1:200000 IJ SOLN
INTRAMUSCULAR | Status: DC | PRN
  Administered 2020-09-07: 30 mL

## 2020-09-07 MED ORDER — FENTANYL CITRATE (PF) 100 MCG/2ML IJ SOLN: 25.0000 ug | INTRAMUSCULAR | Status: DC | PRN

## 2020-09-07 MED ORDER — OXYCODONE HCL 5 MG PO TABS
5.0000 mg | ORAL_TABLET | Freq: Once | ORAL | Status: AC
  Administered 2020-09-07: 5 mg via ORAL

## 2020-09-07 MED ORDER — CHLORHEXIDINE GLUCONATE 0.12 % MT SOLN
OROMUCOSAL | Status: AC
  Administered 2020-09-07: 15 mL via OROMUCOSAL
  Filled 2020-09-07: qty 15

## 2020-09-07 MED ORDER — METOCLOPRAMIDE HCL 5 MG/ML IJ SOLN: 5.0000 mg | Freq: Three times a day (TID) | INTRAMUSCULAR | Status: DC | PRN

## 2020-09-07 MED ORDER — SODIUM CHLORIDE 0.9 % IV SOLN
INTRAVENOUS | Status: DC | PRN
  Administered 2020-09-07: 60 mL

## 2020-09-07 MED ORDER — ONDANSETRON HCL 4 MG/2ML IJ SOLN: 4.0000 mg | Freq: Four times a day (QID) | INTRAMUSCULAR | Status: DC | PRN

## 2020-09-07 MED ORDER — ACETAMINOPHEN 500 MG PO TABS: 1000.0000 mg | ORAL_TABLET | Freq: Four times a day (QID) | ORAL | Status: DC

## 2020-09-07 MED ORDER — OXYCODONE HCL 5 MG/5ML PO SOLN: 5.0000 mg | Freq: Once | ORAL | Status: AC | PRN

## 2020-09-07 MED ORDER — CEFAZOLIN SODIUM-DEXTROSE 2-4 GM/100ML-% IV SOLN
2.0000 g | Freq: Four times a day (QID) | INTRAVENOUS | Status: DC
  Filled 2020-09-07: qty 100

## 2020-09-07 MED ORDER — OXYCODONE HCL 5 MG PO TABS
5.0000 mg | ORAL_TABLET | Freq: Once | ORAL | Status: AC | PRN
  Administered 2020-09-07: 5 mg via ORAL

## 2020-09-07 MED ORDER — KETOROLAC TROMETHAMINE 30 MG/ML IJ SOLN: 30.0000 mg | Freq: Once | INTRAMUSCULAR | Status: AC

## 2020-09-07 SURGICAL SUPPLY — 65 items
APL PRP STRL LF DISP 70% ISPRP (MISCELLANEOUS) ×2
BLADE SAW SAG 25X90X1.19 (BLADE) ×3 IMPLANT
BLADE SURG SZ20 CARB STEEL (BLADE) ×3 IMPLANT
BNDG CMPR STD VLCR NS LF 5.8X6 (GAUZE/BANDAGES/DRESSINGS) ×1
BNDG ELASTIC 6X5.8 VLCR NS LF (GAUZE/BANDAGES/DRESSINGS) ×3 IMPLANT
BRNG TIB 0D 75X10 ANT STAB (Insert) ×1 IMPLANT
CANISTER SUCT 1200ML W/VALVE (MISCELLANEOUS) ×3 IMPLANT
CANISTER SUCT 3000ML PPV (MISCELLANEOUS) ×3 IMPLANT
CEMENT BONE R 1X40 (Cement) ×6 IMPLANT
CEMENT VACUUM MIXING SYSTEM (MISCELLANEOUS) ×3 IMPLANT
CHLORAPREP W/TINT 26 (MISCELLANEOUS) ×5 IMPLANT
COMP FEMORAL CRUC LEFT 67.5MM (Joint) ×3 IMPLANT
COMPONENT FEMRL CRUC LT 67.5MM (Joint) IMPLANT
COOLER POLAR GLACIER W/PUMP (MISCELLANEOUS) ×3 IMPLANT
COVER MAYO STAND REUSABLE (DRAPES) ×3 IMPLANT
COVER WAND RF STERILE (DRAPES) ×3 IMPLANT
CUFF TOURN SGL QUICK 24 (TOURNIQUET CUFF)
CUFF TOURN SGL QUICK 30 (TOURNIQUET CUFF) ×3
CUFF TRNQT CYL 24X4X16.5-23 (TOURNIQUET CUFF) IMPLANT
CUFF TRNQT CYL 30X4X21-28X (TOURNIQUET CUFF) IMPLANT
DRAPE 3/4 80X56 (DRAPES) ×3 IMPLANT
DRAPE IMP U-DRAPE 54X76 (DRAPES) ×3 IMPLANT
DRSG MEPILEX SACRM 8.7X9.8 (GAUZE/BANDAGES/DRESSINGS) IMPLANT
DRSG OPSITE POSTOP 4X10 (GAUZE/BANDAGES/DRESSINGS) ×3 IMPLANT
DRSG OPSITE POSTOP 4X8 (GAUZE/BANDAGES/DRESSINGS) ×1 IMPLANT
ELECT REM PT RETURN 9FT ADLT (ELECTROSURGICAL) ×3
ELECTRODE REM PT RTRN 9FT ADLT (ELECTROSURGICAL) ×1 IMPLANT
GLOVE BIO SURGEON STRL SZ7.5 (GLOVE) ×12 IMPLANT
GLOVE BIO SURGEON STRL SZ8 (GLOVE) ×12 IMPLANT
GLOVE BIOGEL PI IND STRL 8 (GLOVE) ×1 IMPLANT
GLOVE BIOGEL PI INDICATOR 8 (GLOVE) ×2
GLOVE INDICATOR 8.0 STRL GRN (GLOVE) ×3 IMPLANT
GOWN STRL REUS W/ TWL LRG LVL3 (GOWN DISPOSABLE) ×1 IMPLANT
GOWN STRL REUS W/ TWL XL LVL3 (GOWN DISPOSABLE) ×1 IMPLANT
GOWN STRL REUS W/TWL LRG LVL3 (GOWN DISPOSABLE) ×3
GOWN STRL REUS W/TWL XL LVL3 (GOWN DISPOSABLE) ×3
HOLDER FOLEY CATH W/STRAP (MISCELLANEOUS) IMPLANT
HOOD PEEL AWAY FLYTE STAYCOOL (MISCELLANEOUS) ×9 IMPLANT
INSERT TIB BEARING 75 10 (Insert) ×2 IMPLANT
KIT TURNOVER KIT A (KITS) ×3 IMPLANT
MANIFOLD NEPTUNE II (INSTRUMENTS) ×3 IMPLANT
NDL SPNL 20GX3.5 QUINCKE YW (NEEDLE) ×1 IMPLANT
NEEDLE SPNL 20GX3.5 QUINCKE YW (NEEDLE) ×3 IMPLANT
NS IRRIG 1000ML POUR BTL (IV SOLUTION) ×3 IMPLANT
PACK TOTAL KNEE (MISCELLANEOUS) ×3 IMPLANT
PAD WRAPON POLAR KNEE (MISCELLANEOUS) ×1 IMPLANT
PATELLA STD 34X8.5 (Orthopedic Implant) ×2 IMPLANT
PENCIL SMOKE EVACUATOR (MISCELLANEOUS) ×3 IMPLANT
PLATE KNEE TIBIAL 75MM FIXED (Plate) ×2 IMPLANT
PULSAVAC PLUS IRRIG FAN TIP (DISPOSABLE) ×3
SOL .9 NS 3000ML IRR  AL (IV SOLUTION) ×3
SOL .9 NS 3000ML IRR AL (IV SOLUTION) ×1
SOL .9 NS 3000ML IRR UROMATIC (IV SOLUTION) ×1 IMPLANT
STAPLER SKIN PROX 35W (STAPLE) ×3 IMPLANT
SUCTION FRAZIER HANDLE 10FR (MISCELLANEOUS) ×3
SUCTION TUBE FRAZIER 10FR DISP (MISCELLANEOUS) ×1 IMPLANT
SUT VIC AB 0 CT1 36 (SUTURE) ×9 IMPLANT
SUT VIC AB 2-0 CT1 27 (SUTURE) ×15
SUT VIC AB 2-0 CT1 TAPERPNT 27 (SUTURE) ×3 IMPLANT
SYR 10ML LL (SYRINGE) ×3 IMPLANT
SYR 20ML LL LF (SYRINGE) ×3 IMPLANT
SYR 30ML LL (SYRINGE) IMPLANT
TIP FAN IRRIG PULSAVAC PLUS (DISPOSABLE) ×1 IMPLANT
TRAY FOLEY MTR SLVR 16FR STAT (SET/KITS/TRAYS/PACK) IMPLANT
WRAPON POLAR PAD KNEE (MISCELLANEOUS) ×3

## 2020-09-07 NOTE — Transfer of Care (Signed)
Immediate Anesthesia Transfer of Care Note  Patient: Rodney Brooks.  Procedure(s) Performed: TOTAL KNEE ARTHROPLASTY (Left Knee)  Patient Location: PACU  Anesthesia Type:Spinal  Level of Consciousness: awake, alert  and oriented  Airway & Oxygen Therapy: Patient Spontanous Breathing and Patient connected to nasal cannula oxygen  Post-op Assessment: Report given to RN and Post -op Vital signs reviewed and stable  Post vital signs: Reviewed and stable  Last Vitals:  Vitals Value Taken Time  BP    Temp    Pulse    Resp    SpO2      Last Pain:  Vitals:   09/07/20 0620  TempSrc: Oral  PainSc: 0-No pain         Complications: No complications documented.

## 2020-09-07 NOTE — Op Note (Signed)
09/07/2020  10:17 AM  Patient:   Rodney Brooks.  Pre-Op Diagnosis:   Degenerative joint disease, left knee.  Post-Op Diagnosis:   Same  Procedure:   Left TKA using all-cemented [employer] Vanguard system with a 67.5 mm mm PCR femur, a 75 mm tibial tray with a 10 mm anterior stabilized E-poly insert, and a 34 x 8.5 mm all-poly 3-pegged domed patella.  Surgeon:   Pascal Lux, MD  Assistant:   Cameron Proud, PA-C   Anesthesia:   Spinal  Findings:   As above  Complications:   None  EBL:   10 cc  Fluids:   700 cc crystalloid  UOP:   None  TT:   100 minutes at 300 mmHg  Drains:   None  Closure:   Staples  Implants:   As above  Brief Clinical Note:   The patient is a 54 year old male with a history of progressively worsening left knee pain. The patient's symptoms have progressed despite medications, activity modification, injections, etc. The patient's history and examination were consistent with moderate to severe degenerative joint disease of the left knee confirmed by plain radiographs. His MRI scan also demonstrated a medial meniscal root tear. The patient presents at this time for a left total knee arthroplasty.  Procedure:   The patient was brought into the operating room. After adequate spinal anesthesia was obtained, the patient was lain in the supine position before the left lower extremity was prepped with ChloraPrep solution and draped sterilely. Preoperative antibiotics were administered. After verifying the proper laterality with a surgical timeout, the limb was exsanguinated with an Esmarch and the tourniquet inflated to 300 mmHg.   A standard anterior approach to the knee was made through an approximately 7 inch incision. The incision was carried down through the subcutaneous tissues to expose superficial retinaculum. This was split the length of the incision and the medial flap elevated sufficiently to expose the medial retinaculum. The medial retinaculum was  incised, leaving a 3-4 mm cuff of tissue on the patella. This was extended distally along the medial border of the patellar tendon and proximally through the medial third of the quadriceps tendon. A subtotal fat pad excision was performed before the soft tissues were elevated off the anteromedial and anterolateral aspects of the proximal tibia to the level of the collateral ligaments. The anterior portions of the medial and lateral menisci were removed, as was the anterior cruciate ligament. With the knee flexed to 90, the external tibial guide was positioned and the appropriate proximal tibial cut made. This piece was taken to the back table where it was measured and found to be optimally replicated by a 75 mm component.  Attention was directed to the distal femur. The intramedullary canal was accessed through a 3/8" drill hole. The intramedullary guide was inserted and positioned in order to obtain a neutral flexion gap. The intercondylar block was positioned with care taken to avoid notching the anterior cortex of the femur. The appropriate cut was made. Next, the distal cutting block was placed at 5 of valgus alignment. Using the 9 mm slot, the distal cut was made. The distal femur was measured and found to be optimally replicated by the 82.4 mm component. The 67.5 mm 4-in-1 cutting block was positioned and first the posterior, then the posterior chamfer, the anterior chamfer, and finally the anterior cuts were made. At this point, the posterior portions medial and lateral menisci were removed. A trial reduction was performed using the  appropriate femoral and tibial components with the 10 mm insert. This demonstrated excellent stability to varus and valgus stressing both in flexion and extension while permitting full extension. Patella tracking was assessed and found to be excellent. Therefore, the tibial guide position was marked on the proximal tibia. The patella thickness was measured and found to be 22  mm. Therefore, the appropriate cut was made. The patellar surface was measured and found to be optimally replicated by the 34 mm component. The three peg holes were drilled in place before the trial button was inserted. Patella tracking was assessed and found to be excellent, passing the "no thumb test". The lug holes were drilled into the distal femur before the trial component was removed, leaving only the tibial tray. The keel was then created using the appropriate tower, reamer, and punch.  The bony surfaces were prepared for cementing by irrigating them thoroughly with sterile saline solution via the jet lavage system. A bone plug was fashioned from some of the bone that had been removed previously and used to plug the distal femoral canal. In addition, 20 cc of Exparel diluted out to 60 cc with normal saline and 30 cc of 0.5% Sensorcaine were injected into the postero-medial and postero-lateral aspects of the knee, the medial and lateral gutter regions, and the peri-incisional tissues to help with postoperative analgesia. Meanwhile, the cement was being mixed on the back table. When it was ready, the tibial tray was cemented in first. The excess cement was removed using [employer] fast streamer. Next, the femoral component was impacted into place. Again, the excess cement was removed using [employer] fast streamer. The 10 mm trial insert was positioned and the knee brought into extension while the cement hardened. Finally, the patella was cemented into place and secured using the patellar clamp. Again, the excess cement was removed using [employer] fast streamer. Once the cement had hardened, the knee was placed through a range of motion with the findings as described above. Therefore, the trial insert was removed and, after verifying that no cement had been retained posteriorly, the permanent 10 mm anterior stabilized E-polyethylene insert was positioned and secured using the appropriate key locking mechanism. Again the knee was  placed through a range of motion with the findings as described above.  The wound was copiously irrigated with sterile saline solution using the jet lavage system before the quadriceps tendon and retinacular layer were reapproximated using #0 Vicryl interrupted sutures. The superficial retinacular layer also was closed using a running #0 Vicryl suture. A total of 10 cc of transexemic acid (TXA) was injected intra-articularly before the subcutaneous tissues were closed in several layers using 2-0 Vicryl interrupted sutures. The skin was closed using staples. A sterile honeycomb dressing was applied to the skin before the leg was wrapped with an Ace wrap to accommodate the Polar Care device. The patient was then awakened and returned to the recovery room in satisfactory condition after tolerating the procedure well.

## 2020-09-07 NOTE — H&P (Signed)
History of Present Illness: Rodney Brooks. is a 54 y.o. who presents today for history and physical. He is to undergo a left total knee arthroplasty on 09/07/2020. Last seen in the clinic on 06/11/2020. There is been no change in his condition since that time.  The patient notes that he slipped on a step causing his left leg to jam into the step below it. Although he did not fall, he felt increased pain in the knee due to the extension type injury. He saw Cameron Proud, PA-C, who gave him a steroid injection which provided substantial relief of his symptoms for a few days before his symptoms began to recur. Therefore, the patient was sent for an MRI scan and referred to me for further evaluation and treatment. He reports 4/10 pain on today's visit. The pain is located along the anterior, posterior, and medial aspects of the knee. The pain is described as aching, stabbing and "stinging". The symptoms are aggravated with normal daily activities, using stairs, at higher levels of activity, walking, standing and standing pivot. He also describes no mechanical symptoms. He has associated mild swelling and deformity. He has tried acetaminophen, anti-inflammatories and steroid injections with temporary partial relief.  Past Medical History: History reviewed. No pertinent past medical history.  Past Surgical History: . Arthroscopic medial meniscus repair with abrasion chondroplasty of grade 3 chondromalacial changes involving the medial femoral condyle medial tibial plateau, lateral tinial plateau and patella right knee Right 04/24/2019  Dr.Ricci Paff   Past Family History: . No Known Problems Mother  . No Known Problems Father   Medications: . hydroCHLOROthiazide (HYDRODIURIL) 12.5 MG tablet Take 12.5 mg by mouth once daily.  Marland Kitchen ibuprofen-acetaminophen 125-250 mg Tab Take 1 tablet by mouth as needed  . lisinopril (PRINIVIL,ZESTRIL) 10 MG tablet Take 10 mg by mouth once daily.  . sildenafiL (VIAGRA) 100  MG tablet Take 25 mg by mouth once as needed  . simvastatin (ZOCOR) 40 MG tablet Take 40 mg by mouth once daily   Allergies: . Sulfa (Sulfonamide Antibiotics) Rash   Review of Systems: A comprehensive 14 point ROS was performed, reviewed, and the pertinent orthopaedic findings are documented in the HPI.  Physical Exam: BP (!) 140/90  Ht 177.8 cm (5\' 10" )  Wt (!) 112.4 kg (247 lb 12.8 oz)  BMI 35.56 kg/m   General: Well-developed well-nourished male seen in no acute distress.   HEENT: Atraumatic,normocephalic. Pupils are equal and reactive to light. Oropharynx is clear with moist mucosa  Lungs: Clear to auscultation bilaterally   Cardiovascular: Regular rate and rhythm. Normal S1, S2. No murmurs. No appreciable gallops or rubs. Peripheral pulses are palpable.  Abdomen: Soft, non-tender, nondistended. Bowel sounds present  Extremity: Left knee exam: GAIT: mild limp and uses no assistive devices. ALIGNMENT: mild varus SKIN: unremarkable SWELLING: mild EFFUSION: trace WARMTH: no warmth TENDERNESS: Mild-moderate over the medial joint line ROM: 0 to 130 degrees with mild pain at the extremes of flexion and extension McMURRAY'S: positive PATELLOFEMORAL: normal tracking with no peri-patellar tenderness and negative apprehension sign CREPITUS: no LACHMAN'S: negative PIVOT SHIFT: negative ANTERIOR DRAWER: negative POSTERIOR DRAWER: negative VARUS/VALGUS: Mild pseudolaxity to varus stressing but no true ligamentous laxity.  Neurological: The patient is alert and oriented Sensation to light touch appears to be intact and within normal limits Gross motor strength appeared to be equal to 5/5  Vascular : Peripheral pulses felt to be palpable. Capillary refill appears to be intact and within normal limits  X-ray: X-rays taken in  [place] clinic showed moderate degenerative changes. This was primarily involving the medial compartment with 40% medial joint space narrowing.  Overall alignment is mild varus. No fractures, lytic lesions or abnormal normal calcification noted.  MRI of the left knee showed evidence of radial tear involving the posterior horn of the medial meniscus with medial meniscal extrusion. There is also evidence of moderate degenerative changes of the medial more so than the patellofemoral compartment as well as a focal area of cartilage loss involving the lateral compartment. The medial femoral condyle is notable for increased bone marrow edema with a possible insufficiency fracture line.  Impression: 1. Degenerative arthrosis left knee 2. Complex tear medial meniscus left knee  Plan: 1. Patient currently taking no anticoagulation medication.   H&P reviewed and patient re-examined. No changes.

## 2020-09-07 NOTE — TOC Transition Note (Addendum)
Transition of Care [facility]) - CM/SW Discharge Note   Patient Details  Name: Rodney Brooks. MRN: [id number] Date of Birth: May 17, 1966  Transition of Care [facility] CM/SW Contact:  Ova Freshwater Phone Number: [phone] 09/07/2020, 4:21 PM   Clinical Narrative:     Patient will d/c home with a DME rolling walker, [employer]. [employer] delivering the walker to the patient's post-op room.        Patient Goals and CMS Choice        Discharge Placement                       Discharge Plan and Services                                     Social Determinants of Health (SDOH) Interventions     Readmission Risk Interventions No flowsheet data found.

## 2020-09-07 NOTE — Evaluation (Signed)
Physical Therapy Evaluation Patient Details Name: Rodney Brooks. MRN: [id number] DOB: [birth year] Today's Date: 09/07/2020   History of Present Illness  Pt admitted for L TKR  Clinical Impression  Pt is a pleasant 54 year old male who was admitted for L TKR. Pt performs bed mobility, transfers, and ambulation with cga and RW. Safe technique with stair training. Pt demonstrates deficits with strength/mobility/pain. Pt demonstrates ability to perform 10 SLRs with independence, therefore does not require KI for mobility. Ready for discharge at this time. Would benefit from skilled PT to address above deficits and promote optimal return to PLOF. Recommend transition to [place] upon discharge from acute hospitalization.     Follow Up Recommendations Home health PT    Equipment Recommendations  Rolling walker with 5" wheels    Recommendations for Other Services       Precautions / Restrictions Precautions Precautions: Fall;Knee Precaution Booklet Issued: Yes (comment) Restrictions Weight Bearing Restrictions: Yes LLE Weight Bearing: Weight bearing as tolerated      Mobility  Bed Mobility Overal bed mobility: Needs Assistance Bed Mobility: Supine to Sit     Supine to sit: Min guard     General bed mobility comments: safe technique with slight assist required for managing L knee. Once seated, upright posture noted    Transfers Overall transfer level: Needs assistance Equipment used: Rolling walker (2 wheeled) Transfers: Sit to/from Stand Sit to Stand: Min guard         General transfer comment: Cues for hand placement  Ambulation/Gait Ambulation/Gait assistance: Min guard Gait Distance (Feet): 50 Feet Assistive device: Rolling walker (2 wheeled) Gait Pattern/deviations: Step-to pattern     General Gait Details: needs cues for rolling walker, tends to pick up. Step to gait pattern. Increased pain  Stairs Stairs: Yes Stairs assistance: Min guard Stair  Management: Two rails;Step to pattern Number of Stairs: 4 General stair comments: up/down with railing. Demonstrated prior to performance  Wheelchair Mobility    Modified Rankin (Stroke Patients Only)       Balance Overall balance assessment: Needs assistance Sitting-balance support: Feet supported Sitting balance-Leahy Scale: Good     Standing balance support: Bilateral upper extremity supported Standing balance-Leahy Scale: Good                               Pertinent Vitals/Pain Pain Assessment: 0-10 Pain Score: 10-Worst pain ever Pain Location: L knee Pain Descriptors / Indicators: Operative site guarding Pain Intervention(s): Limited activity within patient's tolerance;Ice applied    Home Living Family/patient expects to be discharged to:: Private residence Living Arrangements: Spouse/significant other;Children Available Help at Discharge: Family;Available 24 hours/day Type of Home: House Home Access: Stairs to enter Entrance Stairs-Rails: Can reach both Entrance Stairs-Number of Steps: 4 Home Layout: One level Home Equipment: Walker - 4 wheels;Shower seat      Prior Function Level of Independence: Independent               Hand Dominance        Extremity/Trunk Assessment   Upper Extremity Assessment Upper Extremity Assessment: Overall WFL for tasks assessed    Lower Extremity Assessment Lower Extremity Assessment: Generalized weakness (L LE grossly 3/5; R LE grossly 5/5)       Communication   Communication: No difficulties  Cognition Arousal/Alertness: Awake/alert Behavior During Therapy: WFL for tasks assessed/performed Overall Cognitive Status: Within Functional Limits for tasks assessed  General Comments      Exercises Total Joint Exercises Goniometric ROM: L knee AAROM: 2-70 degrees Other Exercises Other Exercises: supine/seated ther-ex including L AP, quad sets,  SLRs, hip abd/add, SAQ, and seated heel slides. All ther-ex performed x 10 reps with cga. Written HEP given and reviewed   Assessment/Plan    PT Assessment Patient needs continued PT services  PT Problem List Decreased strength;Decreased balance;Decreased mobility;Pain;Decreased knowledge of use of DME       PT Treatment Interventions DME instruction;Gait training;Therapeutic exercise;Stair training    PT Goals (Current goals can be found in the Care Plan section)  Acute Rehab PT Goals Patient Stated Goal: to go home PT Goal Formulation: With patient Time For Goal Achievement: 09/21/20 Potential to Achieve Goals: Good    Frequency BID   Barriers to discharge        Co-evaluation               AM-PAC PT "6 Clicks" Mobility  Outcome Measure Help needed turning from your back to your side while in a flat bed without using bedrails?: None Help needed moving from lying on your back to sitting on the side of a flat bed without using bedrails?: None Help needed moving to and from a bed to a chair (including a wheelchair)?: A Little Help needed standing up from a chair using your arms (e.g., wheelchair or bedside chair)?: A Little Help needed to walk in hospital room?: A Little Help needed climbing 3-5 steps with a railing? : A Little 6 Click Score: 20    End of Session Equipment Utilized During Treatment: Gait belt Activity Tolerance: Patient tolerated treatment well Patient left: in chair;with family/visitor present Nurse Communication: Mobility status PT Visit Diagnosis: Muscle weakness (generalized) (M62.81);Difficulty in walking, not elsewhere classified (R26.2);Pain Pain - Right/Left: Left Pain - part of body: Knee    Time: 0240-9735 PT Time Calculation (min) (ACUTE ONLY): 46 min   Charges:   PT Evaluation $PT Eval Low Complexity: 1 Low PT Treatments $Gait Training: 8-22 mins $Therapeutic Exercise: 8-22 mins        Greggory Stallion, PT,  DPT [phone]   Jaila Schellhorn 09/07/2020, 4:24 PM

## 2020-09-07 NOTE — Anesthesia Preprocedure Evaluation (Signed)
Anesthesia Evaluation  Patient identified by MRN, date of birth, ID band Patient awake    Reviewed: Allergy & Precautions, NPO status , Patient's Chart, lab work & pertinent test results  History of Anesthesia Complications Negative for: history of anesthetic complications  Airway Mallampati: II  TM Distance: >3 FB Neck ROM: Full    Dental  (+) Teeth Intact   Pulmonary neg pulmonary ROS, sleep apnea and Continuous Positive Airway Pressure Ventilation , neg COPD, Patient abstained from smoking.Not current smoker, former smoker,    Pulmonary exam normal breath sounds clear to auscultation       Cardiovascular Exercise Tolerance: Good METShypertension, Pt. 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22 No date: Sleep apnea     Comment:  USES CPAP   Reproductive/Obstetrics                             Anesthesia Physical  Anesthesia Plan  ASA: II  Anesthesia Plan: General/Spinal   Post-op Pain Management:    Induction: Intravenous  PONV Risk Score and Plan: 2 and Ondansetron, Dexamethasone, Propofol infusion, TIVA and Midazolam  Airway Management Planned: Natural Airway  Additional Equipment: None  Intra-op Plan:   Post-operative Plan:   Informed Consent: I have reviewed the patients  History and Physical, chart, labs and discussed the procedure including the risks, benefits and alternatives for the proposed anesthesia with the patient or authorized representative who has indicated his/her understanding and acceptance.       Plan Discussed with: CRNA and Surgeon  Anesthesia Plan Comments: (Discussed R/B/A of neuraxial anesthesia technique with patient: - rare risks of spinal/epidural hematoma, nerve damage, infection - Risk of PDPH - Risk of nausea and vomiting - Risk of conversion to general anesthesia and its associated risks, including sore throat, damage to lips/teeth/oropharynx, and rare risks such as cardiac and respiratory events.  Patient voiced understanding.)        Anesthesia Quick Evaluation

## 2020-09-07 NOTE — Discharge Instructions (Addendum)
   Follow Physical therapy instructions.    Orthopedic discharge instructions: May shower with intact OpSite dressing. Apply ice frequently to knee or use Polar Care. Take Eliquis 2.5 mg twice daily starting tomorrow for 2 weeks, then take aspirin 325 mg daily for 4 weeks. Take oxycodone as prescribed when needed.  May supplement with ES Tylenol if necessary. May weight-bear as tolerated - use walker or crutches as needed for balance and support. Follow-up in 10-14 days or as scheduled.  AMBULATORY SURGERY  DISCHARGE INSTRUCTIONS   1) The drugs that you were given will stay in your system until tomorrow so for the next 24 hours you should not:  A) Drive an automobile B) Make any legal decisions C) Drink any alcoholic beverage   2) You may resume regular meals tomorrow.  Today it is better to start with liquids and gradually work up to solid foods.  You may eat anything you prefer, but it is better to start with liquids, then soup and crackers, and gradually work up to solid foods.   3) Please notify your doctor immediately if you have any unusual bleeding, trouble breathing, redness and pain at the surgery site, drainage, fever, or pain not relieved by medication.    4) Additional Instructions:        Please contact your physician with any problems or Same Day Surgery at [phone], Monday through Friday 6 am to 4 pm, or [place] [facility] number at [phone].

## 2020-09-07 NOTE — Anesthesia Postprocedure Evaluation (Signed)
Anesthesia Post Note  Patient: Rodney Brooks.  Procedure(s) Performed: TOTAL KNEE ARTHROPLASTY (Left Knee)  Patient location during evaluation: PACU Anesthesia Type: Combined General/Spinal Level of consciousness: oriented and awake and alert Pain management: pain level controlled Vital Signs Assessment: post-procedure vital signs reviewed and stable Respiratory status: spontaneous breathing, respiratory function stable and patient connected to nasal cannula oxygen Cardiovascular status: blood pressure returned to baseline and stable Postop Assessment: no headache, no backache and no apparent nausea or vomiting Anesthetic complications: no   No complications documented.   Last Vitals:  Vitals:   09/07/20 1146 09/07/20 1346  BP: 136/90 129/86  Pulse: 74   Resp: 16 16  Temp: 36.6 C 36.8 C  SpO2: 100% 98%    Last Pain:  Vitals:   09/07/20 1346  TempSrc: Temporal  PainSc: 8                  Arita Miss

## 2020-09-13 ENCOUNTER — Other Ambulatory Visit: Payer: Self-pay | Admitting: Student

## 2020-09-13 ENCOUNTER — Emergency Department: Payer: BC Managed Care – PPO

## 2020-09-13 ENCOUNTER — Ambulatory Visit: Payer: BC Managed Care – PPO

## 2020-09-13 ENCOUNTER — Other Ambulatory Visit: Payer: Self-pay

## 2020-09-13 ENCOUNTER — Emergency Department
Admission: EM | Admit: 2020-09-13 | Discharge: 2020-09-13 | Disposition: A | Discharge status: Home / Self Care | Payer: BC Managed Care – PPO | Attending: Student in an Organized Health Care Education/Training Program | Admitting: Student in an Organized Health Care Education/Training Program

## 2020-09-13 ENCOUNTER — Other Ambulatory Visit (HOSPITAL_COMMUNITY): Payer: Self-pay | Admitting: Student

## 2020-09-13 ENCOUNTER — Encounter: Payer: Self-pay | Admitting: Emergency Medicine

## 2020-09-13 DIAGNOSIS — I129 Hypertensive chronic kidney disease with stage 1 through stage 4 chronic kidney disease, or unspecified chronic kidney disease: Secondary | ICD-10-CM | POA: Insufficient documentation

## 2020-09-13 DIAGNOSIS — R600 Localized edema: Secondary | ICD-10-CM | POA: Diagnosis present

## 2020-09-13 DIAGNOSIS — Z79899 Other long term (current) drug therapy: Secondary | ICD-10-CM | POA: Diagnosis not present

## 2020-09-13 DIAGNOSIS — Z7901 Long term (current) use of anticoagulants: Secondary | ICD-10-CM | POA: Insufficient documentation

## 2020-09-13 DIAGNOSIS — Z96652 Presence of left artificial knee joint: Secondary | ICD-10-CM | POA: Diagnosis not present

## 2020-09-13 DIAGNOSIS — N189 Chronic kidney disease, unspecified: Secondary | ICD-10-CM | POA: Insufficient documentation

## 2020-09-13 DIAGNOSIS — Z87891 Personal history of nicotine dependence: Secondary | ICD-10-CM | POA: Insufficient documentation

## 2020-09-13 DIAGNOSIS — M7989 Other specified soft tissue disorders: Secondary | ICD-10-CM

## 2020-09-13 NOTE — ED Triage Notes (Signed)
First Nurse Note:  Arrives with c/o left calf pain x 2 days.  States had a left knee replacement on 12/7.  Has been wearing compression hose and taking Eliquis as prescribed.  AAOx3.  Skin warm and dry. NAD

## 2020-09-13 NOTE — Discharge Instructions (Addendum)
Your exam and [place] are normal at this time. There is no evidence of a DVT on your scan. Rest with the leg elevated and apply ice as directed. Take your pain medicine on schedule. Follow-up with Dr. Roland Rack as scheduled.

## 2020-09-13 NOTE — ED Notes (Signed)
Pt has left lower leg pain.  Pt had knee replacement 2 days ago.  Pt has increased pain today.  No redness  No drainage from incision.  No sob. Pt on blood thinner.  Pt alert  Speech c lear  Family with pt

## 2020-09-13 NOTE — ED Provider Notes (Signed)
[facility] Emergency Department Provider Note ____________________________________________  Time seen: 1612  I have reviewed the triage vital signs and the nursing notes.  HISTORY  Chief Complaint  Leg Pain  HPI Rodney Brooks. is a 54 y.o. male presents to the ED for evaluation of left lower extremity swelling.  Patient is about 10 days status post total knee arthroplasty on the left, and presented today after is noted some increased swelling and pain to the calf.  He denied any chest pain, shortness of breath, or distal paresthesias.  He admits to being on his feet and having his pain well controlled with his pain medicines.  He noted onset of increasing swelling 2 days ago.  He presents at the advice of his surgery group for evaluation for DVT as a postop complication.  Past Medical History:  Diagnosis Date  . Arthritis   . Chronic kidney disease    has one kidney  . Dysrhythmia    bundle branch block  . Hypertension   . 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 Sleep apnea    USES CPAP    Patient Active Problem List   Diagnosis Date Noted  . Special screening for malignant neoplasms, colon     Past Surgical History:  Procedure Laterality Date  . CARPAL TUNNEL RELEASE Bilateral   . COLONOSCOPY N/A 11/22/2018   Procedure: COLONOSCOPY;  Surgeon: Danie Binder, MD;  [facility];  Service: Endoscopy;  Laterality: N/A;  2:30  . 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 KNEE ARTHROSCOPY WITH MEDIAL MENISECTOMY Right 04/24/2019   Procedure: KNEE ARTHROSCOPY WITH DEBRIDEMENT AND REPAIR VS. PARTIAL MEDIAL MENISECTOMY;  Surgeon: Corky Mull, MD;  Location: [facility] ORS;  Service: Orthopedics;  Laterality: Right;  . SALIVARY STONE REMOVAL    . TOTAL KNEE ARTHROPLASTY Left 09/07/2020   Procedure: TOTAL KNEE ARTHROPLASTY;  Surgeon: Corky Mull, MD;  Location: [facility] ORS;  Service: Orthopedics;  Laterality:  Left;    Prior to Admission medications   Medication Sig Start Date End Date Taking? Authorizing Provider  apixaban (ELIQUIS) 2.5 MG TABS tablet Take 1 tablet (2.5 mg total) by mouth 2 (two) times daily. 09/07/20   Poggi, Marshall Cork, MD  apixaban (ELIQUIS) 2.5 MG TABS tablet Take 1 tablet (2.5 mg total) by mouth 2 (two) times daily. 09/08/20   Poggi, Marshall Cork, MD  hydrochlorothiazide (MICROZIDE) 12.5 MG capsule Take 12.5 mg by mouth every morning.  04/30/18   [provider]  HYDROcodone-acetaminophen (NORCO) 5-325 MG tablet Take 1-2 tablets by mouth every 6 (six) hours as needed for moderate pain or severe pain. MAXIMUM TOTAL ACETAMINOPHEN DOSE IS 4000 MG PER DAY 09/07/20   Poggi, Marshall Cork, MD  lisinopril (PRINIVIL,ZESTRIL) 10 MG tablet Take 10 mg by mouth every morning.     [provider]  oxyCODONE (ROXICODONE) 5 MG immediate release tablet Take 1-2 tablets (5-10 mg total) by mouth every 4 (four) hours as needed for moderate pain or severe pain. 09/07/20   Poggi, Marshall Cork, MD  sildenafil (VIAGRA) 100 MG tablet Take 20 mg by mouth daily as needed for erectile dysfunction.     [provider]  simvastatin (ZOCOR) 40 MG tablet Take 40 mg by mouth every morning.  04/30/18   [provider]    Allergies Sulfa antibiotics  Family History  Problem Relation Age of  Onset  . Cancer Mother   . Kidney failure Father   . Colon cancer Neg Hx   . Colon polyps Neg Hx     Social History Social History   Tobacco Use  . Smoking status: Former Smoker    Types: Cigarettes  . Smokeless tobacco: Current User    Types: Snuff  . Tobacco comment: ONLY SMOKES SOCIALLY NOW BUT SMOKED DAILY AND QUIT IN 1997  Vaping Use  . Vaping Use: Never used  Substance Use Topics  . Alcohol use: Yes    Alcohol/week: 14.0 standard drinks    Types: 14 Cans of beer per week    Comment: OCC  . Drug use: No    Review of Systems  Constitutional: Negative for fever. Cardiovascular: Negative for  chest pain. Respiratory: Negative for shortness of breath. Gastrointestinal: Negative for abdominal pain, vomiting and diarrhea. Genitourinary: Negative for dysuria. Musculoskeletal: Negative for back pain. LLE edema as above Skin: Negative for rash. Neurological: Negative for headaches, focal weakness or numbness. ____________________________________________  PHYSICAL EXAM:  VITAL SIGNS: ED Triage Vitals  Enc Vitals Group     BP 09/13/20 1401 (!) 137/93     Pulse Rate 09/13/20 1401 93     Resp 09/13/20 1401 16     Temp 09/13/20 1401 98.7 F (37.1 C)     Temp Source 09/13/20 1401 Oral     SpO2 09/13/20 1401 97 %     Weight 09/13/20 1239 240 lb 1.3 oz (108.9 kg)     Height 09/13/20 1239 5\' 9"  (1.753 m)     Head Circumference --      Peak Flow --      Pain Score 09/13/20 1239 10     Pain Loc --      Pain Edu? --      Excl. in [place]? --     Constitutional: Alert and oriented. Well appearing and in no distress. Head: Normocephalic and atraumatic. Eyes: Conjunctivae are normal. Normal extraocular movements Cardiovascular: Normal rate, regular rhythm. Normal distal pulses and cap refill. Respiratory: Normal respiratory effort. No wheezes/rales/rhonchi. Musculoskeletal: Left lower extremity with subtle soft tissue edema noted.  Patient is without pitting edema to the lower leg.  He is able to demonstrate normal flexion extension range of the hip and knee joints.  Nontender with normal range of motion in all extremities.  Neurologic: Cranial nerves II through XII grossly intact.  Antalgic gait without ataxia. Normal speech and language. No gross focal neurologic deficits are appreciated. Skin:  Skin is warm, dry and intact. No rash noted.  No skin breakdown, ecchymosis, or significant bruising noted. ____________________________________________   RADIOLOGY  Venous US LLE  IMPRESSION: No evidence of left lower extremity  DVT. ____________________________________________  PROCEDURES  Procedures ____________________________________________  INITIAL IMPRESSION / ASSESSMENT AND PLAN / ED COURSE  Patient with ED evaluation of leg swelling 10 days status post total knee arthroplasty.  Patient was evaluated for his lower extremity edema, and was found to have a negative DVT study.  He and his wife are reassured by the exam findings.  Patient reports provement of his pain as well as swelling in the ED.  He is advised to rest with the leg elevated and continue to apply ice to reduce swelling.  He will follow-up with his surgeon in 1 week as scheduled.  Return precautions have been discussed.  Nicolette Bang. was evaluated in Emergency Department on 09/13/2020 for the symptoms described in the history of present illness.  He was evaluated in the context of the global COVID-19 pandemic, which necessitated consideration that the patient might be at risk for infection with the SARS-CoV-2 virus that causes COVID-19. Institutional protocols and algorithms that pertain to the evaluation of patients at risk for COVID-19 are in a state of rapid change based on information released by regulatory bodies including the CDC and federal and state organizations. These policies and algorithms were followed during the patient's care in the ED. ____________________________________________  FINAL CLINICAL IMPRESSION(S) / ED DIAGNOSES  Final diagnoses:  Edema leg      Gregg Winchell, Dannielle Karvonen, PA-C 09/13/20 1801    Merlyn Lot, MD 09/13/20 [phone]

## 2020-10-11 ENCOUNTER — Ambulatory Visit (INDEPENDENT_AMBULATORY_CARE_PROVIDER_SITE_OTHER): Payer: BC Managed Care – PPO | Admitting: Internal Medicine

## 2020-10-11 VITALS — BP 145/97 | HR 101 | Resp 18 | Ht 70.0 in | Wt 252.0 lb

## 2020-10-11 DIAGNOSIS — Z9989 Dependence on other enabling machines and devices: Secondary | ICD-10-CM

## 2020-10-11 DIAGNOSIS — G4733 Obstructive sleep apnea (adult) (pediatric): Secondary | ICD-10-CM

## 2020-10-11 DIAGNOSIS — Z7189 Other specified counseling: Secondary | ICD-10-CM

## 2020-10-11 DIAGNOSIS — N189 Chronic kidney disease, unspecified: Secondary | ICD-10-CM

## 2020-10-11 DIAGNOSIS — I10 Essential (primary) hypertension: Secondary | ICD-10-CM | POA: Diagnosis not present

## 2020-10-11 NOTE — Patient Instructions (Signed)

## 2020-10-11 NOTE — Progress Notes (Signed)
[facility] [place], [place] [address]  Pulmonary Sleep Medicine   Office Visit Note  Patient Name: Rodney Brooks. DOB: Feb 05, 1966 MRN [id number]    Chief Complaint: Obstructive Sleep Apnea visit  Brief History:  Rodney Brooks is seen today for follow up The patient has a 3 year history of sleep apnea. Patient is not using PAP nightly. He feels that if he is not snoring he doesn't need  It. The patient feels no differenct after sleeping with PAP.  The patient reports no noticeable benefit from PAP use. Reported sleepiness is  unchanged and the Epworth Sleepiness Score is 5 out of 24. The patient does not take naps. The patient complains of the following: occasional stuffiness.  The compliance download shows  Very poor compliance with an average use time of 3.5 hours. The AHI is 2.9  The patient does not complain of limb movements disrupting sleep.  ROS  General: (-) fever, (-) chills, (-) night sweat Nose and Sinuses: (-) nasal stuffiness or itchiness, (-) postnasal drip, (-) nosebleeds, (-) sinus trouble. Mouth and Throat: (-) sore throat, (-) hoarseness. Neck: (-) swollen glands, (-) enlarged thyroid, (-) neck pain. Respiratory: - cough, - shortness of breath, - wheezing. Neurologic: - numbness, - tingling. Psychiatric: - anxiety, - depression   Current Medication: Outpatient Encounter Medications as of 10/11/2020  Medication Sig  . apixaban (ELIQUIS) 2.5 MG TABS tablet Take 1 tablet (2.5 mg total) by mouth 2 (two) times daily.  Marland Kitchen apixaban (ELIQUIS) 2.5 MG TABS tablet Take 1 tablet (2.5 mg total) by mouth 2 (two) times daily.  . hydrochlorothiazide (MICROZIDE) 12.5 MG capsule Take 12.5 mg by mouth every morning.   Marland Kitchen lisinopril (PRINIVIL,ZESTRIL) 10 MG tablet Take 10 mg by mouth every morning.   . sildenafil (VIAGRA) 100 MG tablet Take 20 mg by mouth daily as needed for erectile dysfunction.   . simvastatin (ZOCOR) 40 MG tablet Take 40 mg by mouth  every morning.   . [DISCONTINUED] HYDROcodone-acetaminophen (NORCO) 5-325 MG tablet Take 1-2 tablets by mouth every 6 (six) hours as needed for moderate pain or severe pain. MAXIMUM TOTAL ACETAMINOPHEN DOSE IS 4000 MG PER DAY  . [DISCONTINUED] oxyCODONE (ROXICODONE) 5 MG immediate release tablet Take 1-2 tablets (5-10 mg total) by mouth every 4 (four) hours as needed for moderate pain or severe pain.   No facility-administered encounter medications on file as of 10/11/2020.    Surgical History: Past Surgical History:  Procedure Laterality Date  . CARPAL TUNNEL RELEASE Bilateral   . COLONOSCOPY N/A 11/22/2018   Procedure: COLONOSCOPY;  Surgeon: Danie Binder, MD;  [facility];  Service: Endoscopy;  Laterality: N/A;  2:30  . 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 KNEE ARTHROSCOPY WITH MEDIAL MENISECTOMY Right 04/24/2019   Procedure: KNEE ARTHROSCOPY WITH DEBRIDEMENT AND REPAIR VS. PARTIAL MEDIAL MENISECTOMY;  Surgeon: Corky Mull, MD;  Location: [facility] ORS;  Service: Orthopedics;  Laterality: Right;  . SALIVARY STONE REMOVAL    . 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 Arthritis   . Chronic kidney disease    has one kidney  . Dysrhythmia    bundle branch block  . Hypertension   . 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 Sleep apnea    USES CPAP    Family History: Non contributory to the present illness  Social History: Social History   Socioeconomic History  . Marital status: Married    Spouse name: Not on file  . Number of children: Not on file  . Years of education: Not on file  . Highest education level: Not on file  Occupational History  . Not on file  Tobacco Use  . Smoking status: Former Smoker    Types: Cigarettes  .  Smokeless tobacco: Current User    Types: Snuff  . Tobacco comment: ONLY SMOKES SOCIALLY NOW BUT SMOKED DAILY AND QUIT IN 1997  Vaping Use  . Vaping Use: Never used  Substance and Sexual Activity  . Alcohol use: Yes    Alcohol/week: 14.0 standard drinks    Types: 14 Cans of beer per week    Comment: OCC  . Drug use: No  . Sexual activity: Not on file  Other Topics Concern  . 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 ENGAGED: [employer]   Social Determinants of Health   Financial Resource Strain: Not on file  Food Insecurity: Not on file  Transportation Needs: Not on file  Physical Activity: Not on file  Stress: Not on file  Social Connections: Not on file  Intimate Partner Violence: Not on file    Vital Signs: Blood pressure (!) 145/97, pulse (!) 101, resp. rate 18, height 5\' 10"  (1.778 m), weight 252 lb (114.3 kg), SpO2 96 %.  Examination: General Appearance: The patient is well-developed, well-nourished, and in no distress. Neck Circumference: 47.5 cm Skin: Gross inspection of skin unremarkable. Head: normocephalic, no gross deformities. Eyes: no gross deformities noted. ENT: ears appear grossly normal Neurologic: Alert and oriented. No involuntary movements.    EPWORTH SLEEPINESS SCALE:  Scale:  (0)= no chance of dozing; (1)= slight chance of dozing; (2)= moderate chance of dozing; (3)= high chance of dozing  Chance  Situtation    Sitting and reading: 0    Watching TV: 2    Sitting Inactive in public: 0    As a passenger in car: 0      Lying down to rest: 3    Sitting and talking: 0    Sitting quielty after lunch: 0    In a car, stopped in traffic: 0   TOTAL SCORE:   5 out of 24    SLEEP STUDIES:  1. HST 05/08/18 REI 29 SpO85min 78%   CPAP COMPLIANCE DATA:  Date Range: 10/11/19-10/09/20  Average Daily Use: 3.5 hours  Median Use: 3.4  Compliance  for > 4 Hours: 12%  AHI: 2.9 respiratory events per hour  Days Used: 124/365  Mask Leak: 24.3  95th Percentile Pressure: 6         LABS: Recent Results (from the past 2160 hour(s))  Surgical pcr screen     Status: None   Collection Time: 08/31/20  9:01 AM   Specimen: Nasal Mucosa; Nasal Swab  Result Value Ref Range   MRSA, PCR NEGATIVE NEGATIVE   Staphylococcus aureus NEGATIVE NEGATIVE    Comment: (NOTE) The Xpert SA Assay (FDA approved for NASAL specimens in patients 10 years of age and older), is one component of a comprehensive surveillance program. It is not intended to diagnose infection nor to guide or monitor treatment.  Performed at [facility], [address] [place], [place] [address]   CBC WITH DIFFERENTIAL     Status: None   Collection Time: 08/31/20  9:01 AM  Result Value Ref Range   WBC 8.6 4.0 - 10.5 K/uL   RBC 4.61 4.22 - 5.81 MIL/uL   Hemoglobin 15.2 13.0 - 17.0 g/dL   HCT 44.4 39.0 - 52.0 %   MCV 96.3 80.0 - 100.0 fL   MCH 33.0 26.0 - 34.0 pg   MCHC 34.2 30.0 - 36.0 g/dL   RDW 12.2 11.5 - 15.5 %   Platelets 272 150 - 400 K/uL   nRBC 0.0 0.0 - 0.2 %   Neutrophils Relative % 70 %   Neutro Abs 6.0 1.7 - 7.7 K/uL   Lymphocytes Relative 19 %   Lymphs Abs 1.7 0.7 - 4.0 K/uL   Monocytes Relative 9 %   Monocytes Absolute 0.7 0.1 - 1.0 K/uL   Eosinophils Relative 2 %   Eosinophils Absolute 0.2 0.0 - 0.5 K/uL   Basophils Relative 0 %   Basophils Absolute 0.0 0.0 - 0.1 K/uL   Immature Granulocytes 0 %   Abs Immature Granulocytes 0.02 0.00 - 0.07 K/uL    Comment: Performed at [facility], [address] [place], [place] [address]  Comprehensive metabolic panel     Status: Abnormal   Collection Time: 08/31/20  9:01 AM  Result Value Ref Range   Sodium 135 135 - 145 mmol/L   Potassium 3.8 3.5 - 5.1 mmol/L   Chloride 96 (L) 98 - 111 mmol/L   CO2 27 22 - 32 mmol/L   Glucose, Bld 98 70 - 99 mg/dL    Comment: Glucose reference  range applies only to samples taken after fasting for at least 8 hours.   BUN 23 (H) 6 - 20 mg/dL   Creatinine, Ser 1.28 (H) 0.61 - 1.24 mg/dL   Calcium 9.5 8.9 - 10.3 mg/dL   Total Protein 7.5 6.5 - 8.1 g/dL   Albumin 4.2 3.5 - 5.0 g/dL   AST 29 15 - 41 U/L   ALT 36 0 - 44 U/L   Alkaline Phosphatase 35 (L) 38 - 126 U/L   Total Bilirubin 1.1 0.3 - 1.2 mg/dL   GFR, Estimated >60 >60 mL/min    Comment: (NOTE) Calculated using the CKD-EPI Creatinine Equation (2021)    Anion gap 12 5 - 15    Comment: Performed at [facility], [address], [place], [place] [address]  Type and screen Order type and screen if day of surgery is less than 15 days from draw of preadmission visit or order morning of surgery if day of surgery is greater than 6 days from preadmission visit.     Status: None   Collection Time: 08/31/20  9:01 AM  Result Value Ref Range   ABO/RH(D) A POS    Antibody Screen NEG    Sample Expiration 09/14/2020,2359    Extend sample reason      NO TRANSFUSIONS OR PREGNANCY IN THE PAST 3 MONTHS Performed at [facility], [address] [place],  [address]   Urinalysis, Routine w reflex microscopic     Status: Abnormal   Collection Time: 08/31/20  9:01 AM  Result Value Ref Range   Color, Urine YELLOW (A) YELLOW   APPearance CLEAR (A) CLEAR   Specific Gravity, Urine 1.015 1.005 - 1.030   pH 5.0 5.0 - 8.0   Glucose, UA NEGATIVE NEGATIVE  mg/dL   Hgb urine dipstick MODERATE (A) NEGATIVE   Bilirubin Urine NEGATIVE NEGATIVE   Ketones, ur NEGATIVE NEGATIVE mg/dL   Protein, ur 30 (A) NEGATIVE mg/dL   Nitrite NEGATIVE NEGATIVE   Leukocytes,Ua NEGATIVE NEGATIVE   RBC / HPF 0-5 0 - 5 RBC/hpf   WBC, UA 0-5 0 - 5 WBC/hpf   Bacteria, UA NONE SEEN NONE SEEN   Squamous Epithelial / LPF NONE SEEN 0 - 5   Mucus PRESENT     Comment: Performed at [facility], [address] [place], [place] [address]  SARS CORONAVIRUS 2 (TAT 6-24 HRS) Nasopharyngeal  Nasopharyngeal Swab     Status: None   Collection Time: 09/03/20  1:24 PM   Specimen: Nasopharyngeal Swab  Result Value Ref Range   SARS Coronavirus 2 NEGATIVE NEGATIVE    Comment: (NOTE) SARS-CoV-2 target nucleic acids are NOT DETECTED.  The SARS-CoV-2 RNA is generally detectable in upper and lower respiratory specimens during the acute phase of infection. Negative results do not preclude SARS-CoV-2 infection, do not rule out co-infections with other pathogens, and should not be used as the sole basis for treatment or other patient management decisions. Negative results must be combined with clinical observations, patient history, and epidemiological information. The expected result is Negative.  Fact Sheet for Patients: SugarRoll.be  Fact Sheet for Healthcare Providers: https://www.woods-mathews.com/  This test is not yet approved or cleared by the [place] FDA and  has been authorized for detection and/or diagnosis of SARS-CoV-2 by FDA under an Emergency Use Authorization (EUA). This EUA will remain  in effect (meaning this test can be used) for the duration of the COVID-19 declaration under Se ction 564(b)(1) of the Act, 21 U.S.C. section 360bbb-3(b)(1), unless the authorization is terminated or revoked sooner.  Performed at [facility], [place] [address], [place], [place] [address]   ABO/Rh     Status: None   Collection Time: 09/07/20  6:29 AM  Result Value Ref Range   ABO/RH(D)      A POS Performed at [facility], [address] [place], [place] [address]     Radiology: US Venous Img Lower Unilateral Left  Result Date: 09/13/2020 CLINICAL DATA:  Left calf pain EXAM: LEFT LOWER EXTREMITY VENOUS DOPPLER ULTRASOUND TECHNIQUE: Gray-scale sonography with compression, as well as color and duplex ultrasound, were performed to evaluate the deep venous system(s) from the level of the common femoral vein through  the popliteal and proximal calf veins. COMPARISON:  None. FINDINGS: VENOUS Normal compressibility of the common femoral, superficial femoral, and popliteal veins, as well as the visualized calf veins. Visualized portions of profunda femoral vein and great saphenous vein unremarkable. No filling defects to suggest DVT on grayscale or color Doppler imaging. Doppler waveforms show normal direction of venous flow, normal respiratory plasticity and response to augmentation. Limited views of the contralateral common femoral vein are unremarkable. OTHER None. Limitations: none IMPRESSION: No evidence of left lower extremity DVT. Electronically Signed   By: Macy Mis M.D.   On: 09/13/2020 13:51    No results found.  US Venous Img Lower Unilateral Left  Result Date: 09/13/2020 CLINICAL DATA:  Left calf pain EXAM: LEFT LOWER EXTREMITY VENOUS DOPPLER ULTRASOUND TECHNIQUE: Gray-scale sonography with compression, as well as color and duplex ultrasound, were performed to evaluate the deep venous system(s) from the level of the common femoral vein through the popliteal and proximal calf veins. COMPARISON:  None. FINDINGS: VENOUS Normal compressibility of the common  femoral, superficial femoral, and popliteal veins, as well as the visualized calf veins. Visualized portions of profunda femoral vein and great saphenous vein unremarkable. No filling defects to suggest DVT on grayscale or color Doppler imaging. Doppler waveforms show normal direction of venous flow, normal respiratory plasticity and response to augmentation. Limited views of the contralateral common femoral vein are unremarkable. OTHER None. Limitations: none IMPRESSION: No evidence of left lower extremity DVT. Electronically Signed   By: Macy Mis M.D.   On: 09/13/2020 13:51      Assessment and Plan: Patient Active Problem List   Diagnosis Date Noted  . OSA on CPAP 10/11/2020  . CPAP use counseling 10/11/2020  . Special screening for  malignant neoplasms, colon       The patient does tolerate PAP and reports no noticable benefit from PAP use. The patient was reminded how to increase the humidifier setting and advised to use the CPAP nightly.  He was reminded of the morbidity associated with untreated sleep apnea. The patient was also counselled on the importance of weight loss. He has not yet been cleared to exercise following his knee surgery. The compliance is very poor. The apnea is controlled.   1. OSA- use CPAP nightly. 2. CPAP couseling-Discussed importance of adequate CPAP use as well as proper care and cleaning techniques of machine and all supplies. 3. CKD-Levels remain stable, donor of kidney over 30 years ago 4. HTN-Discussed importance of BP control, monitor and contact PCP with elevated readings  General Counseling: I have discussed the findings of the evaluation and examination with Lynnae Sandhoff.  I have also discussed any further diagnostic evaluation thatmay be needed or ordered today. Kairon verbalizes understanding of the findings of todays visit. We also reviewed his medications today and discussed drug interactions and side effects including but not limited excessive drowsiness and altered mental states. We also discussed that there is always a risk not just to him but also people around him. he has been encouraged to call the office with any questions or concerns that should arise related to todays visit.   I have personally obtained a history, examined the patient, evaluated laboratory and imaging results, formulated the assessment and plan and placed orders.  This patient was seen by Luiz Ochoa, AGNP-C in collaboration with Dr. Devona Konig as a part of collaborative care agreement.  Richelle Ito Saunders Glance, PhD, FAASM  Diplomate, American Board of Sleep Medicine    Allyne Gee, MD [facility] Diplomate ABMS Pulmonary and Critical Care Medicine Sleep medicine

## 2020-12-13 ENCOUNTER — Ambulatory Visit: Payer: BC Managed Care – PPO | Admitting: Internal Medicine

## 2020-12-13 DIAGNOSIS — Z5329 Procedure and treatment not carried out because of patient's decision for other reasons: Secondary | ICD-10-CM

## 2020-12-13 DIAGNOSIS — Z91199 Patient's noncompliance with other medical treatment and regimen due to unspecified reason: Secondary | ICD-10-CM

## 2020-12-13 NOTE — Progress Notes (Signed)
Pt canceled his appointment. Office staff will reach out to reschedule.  

## 2020-12-29 IMAGING — DX DG KNEE 1-2V PORT*L*
2 series · 2 of 2 positions shown · non-contrast
Comparison: None similar

CLINICAL DATA: Knee replacement

EXAM:
PORTABLE LEFT KNEE - 1-2 VIEW

[knee ap]
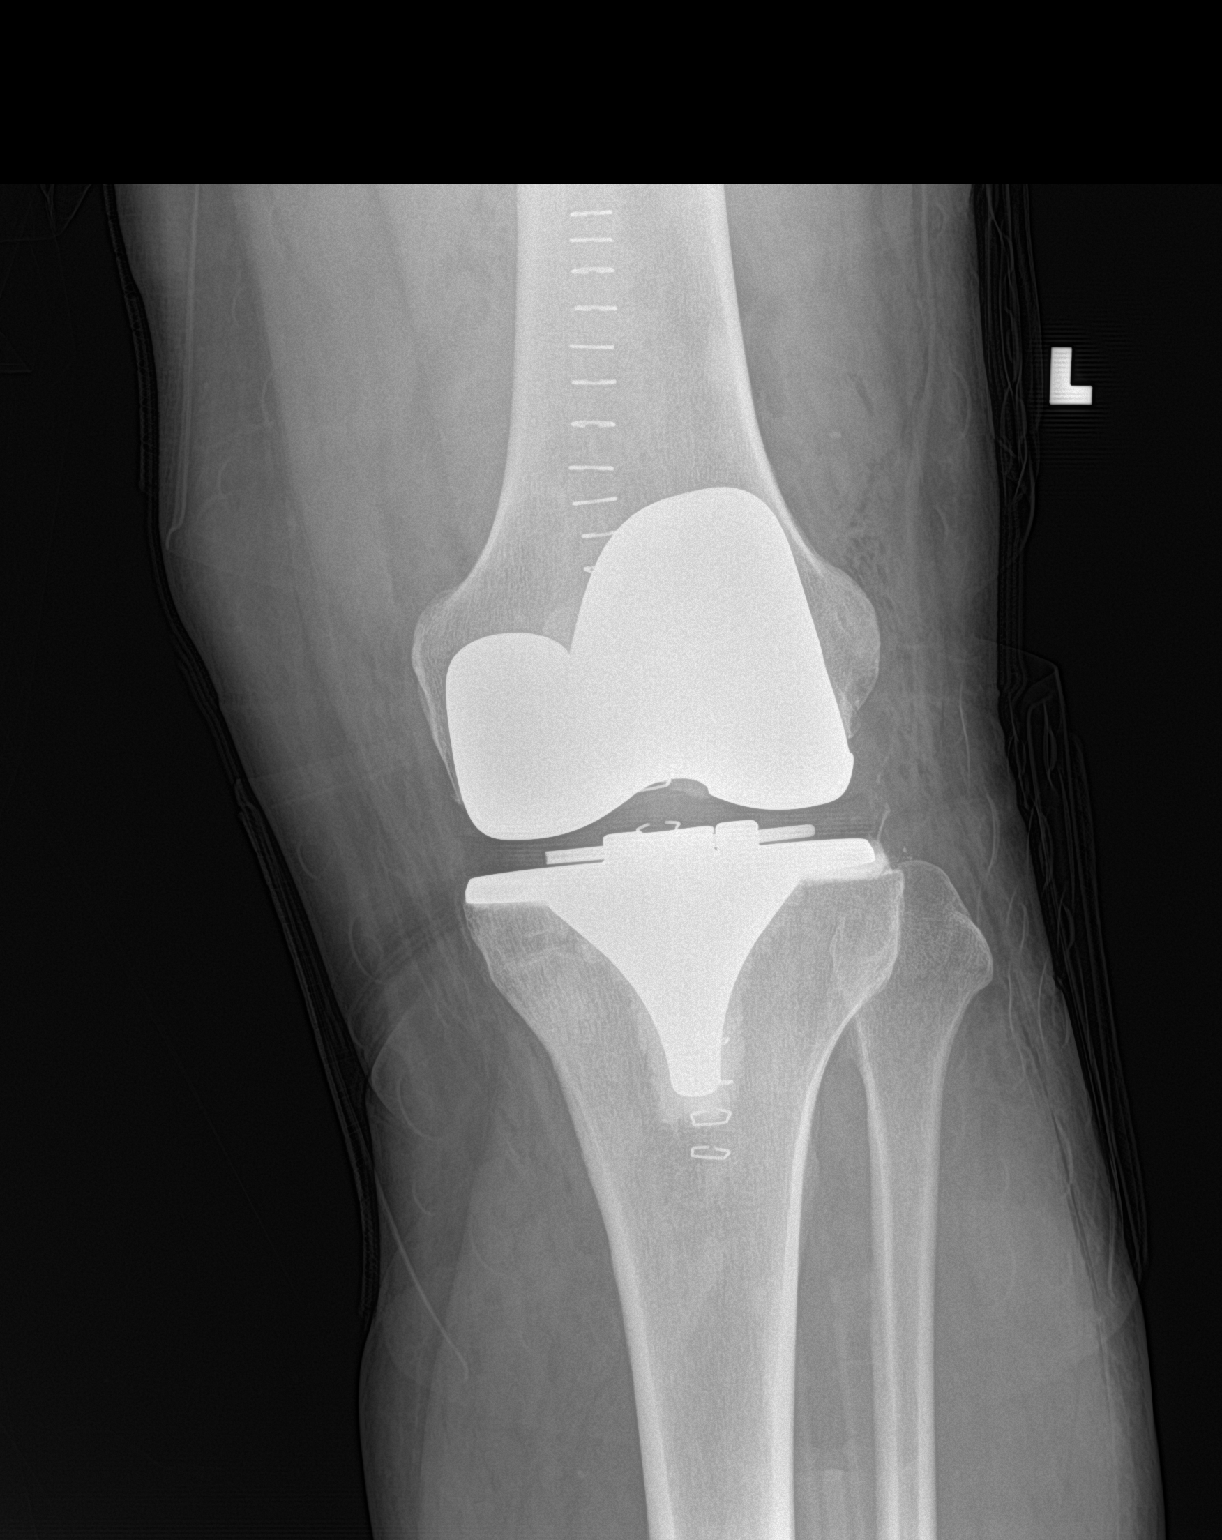

[knee lat]
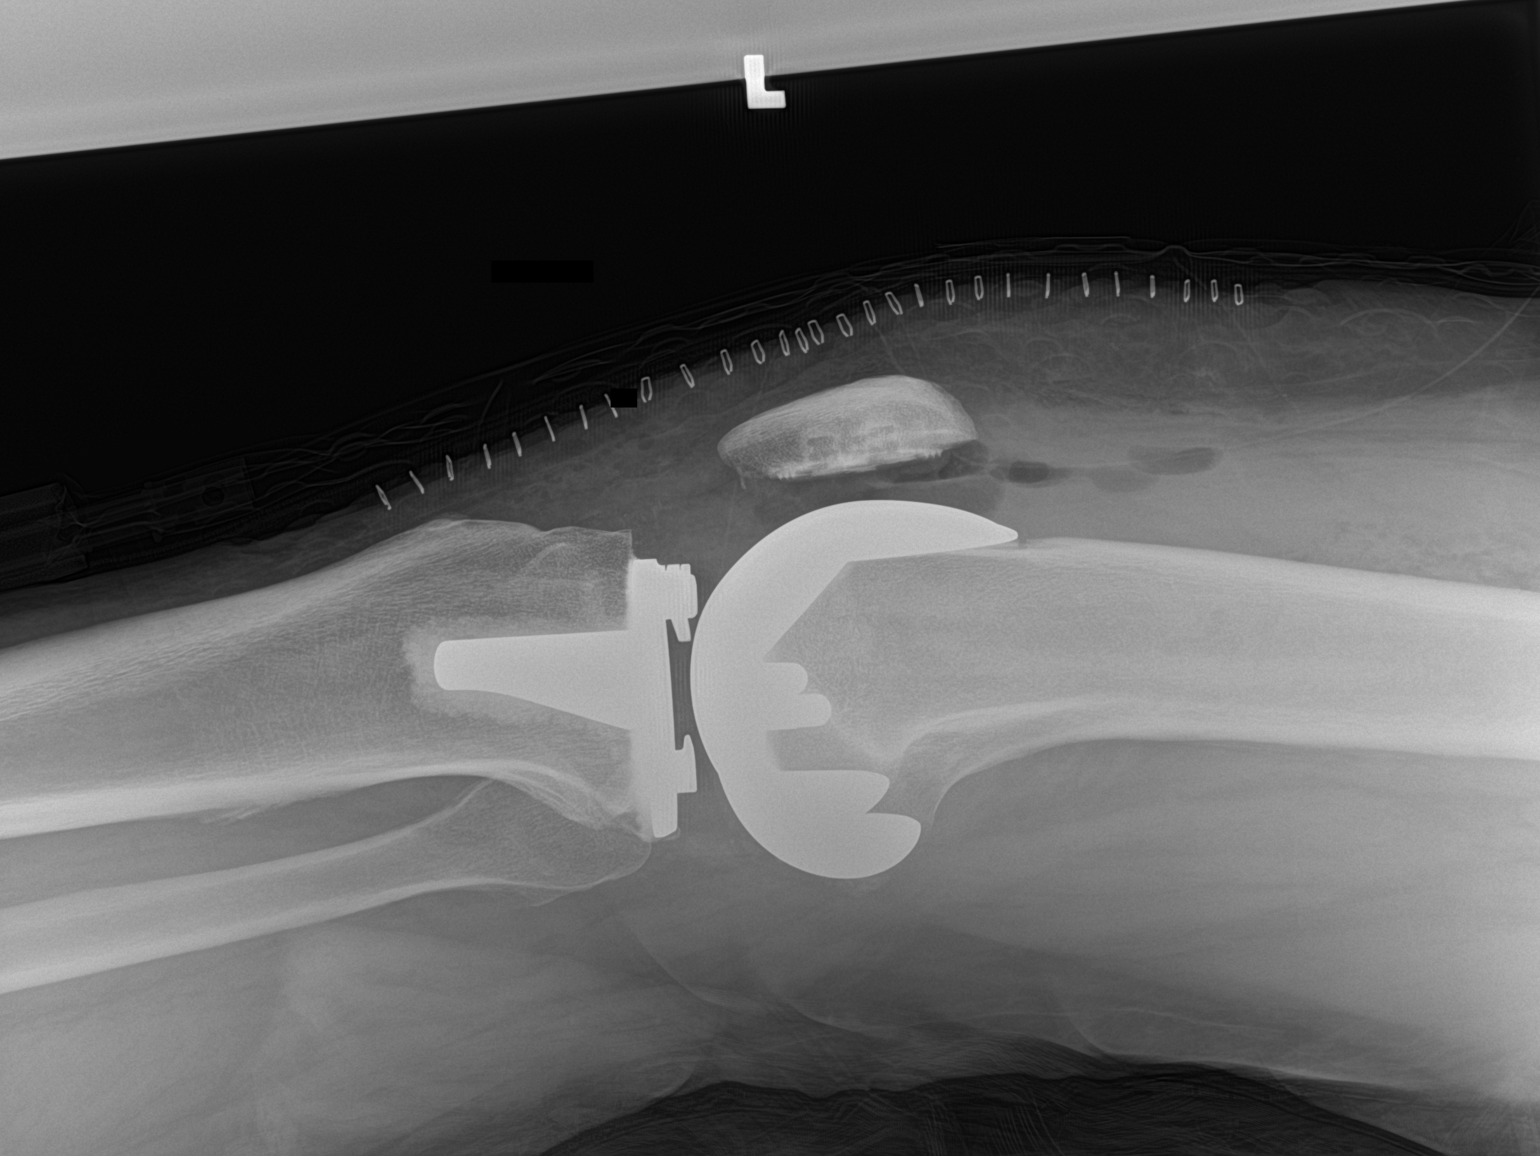

[2 of 2 positions shown; findings below may reference images not displayed]

FINDINGS: Total knee arthroplasty which is well seated. No subluxation or
periprosthetic fracture.
IMPRESSION: Unremarkable total knee arthroplasty.

## 2021-01-03 ENCOUNTER — Ambulatory Visit: Payer: BC Managed Care – PPO | Admitting: Internal Medicine

## 2021-01-03 NOTE — Progress Notes (Signed)
Pt canceled his appointment. Office staff will reach out to reschedule.  

## 2021-01-04 IMAGING — US US EXTREM LOW VENOUS*L*
1 series · 14 of 24 positions shown · non-contrast
Comparison: None.

CLINICAL DATA: Left calf pain

EXAM:
LEFT LOWER EXTREMITY VENOUS DOPPLER ULTRASOUND
TECHNIQUE: Gray-scale sonography with compression, as well as color and duplex
ultrasound, were performed to evaluate the deep venous system(s)
from the level of the common femoral vein through the popliteal and
proximal calf veins.

[Series 1: us venous img lower uni left (dvt) · portal-venous · 14 of 36 slices shown]
[im 1/36]
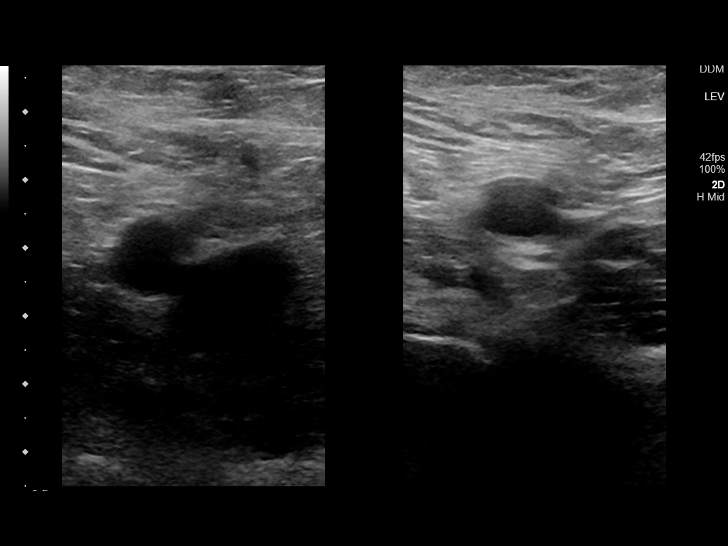
[im 4/36]
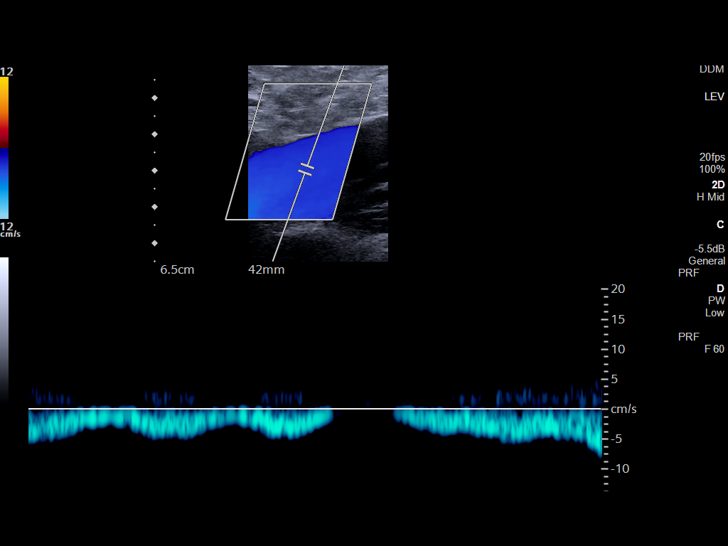
[im 7/36]
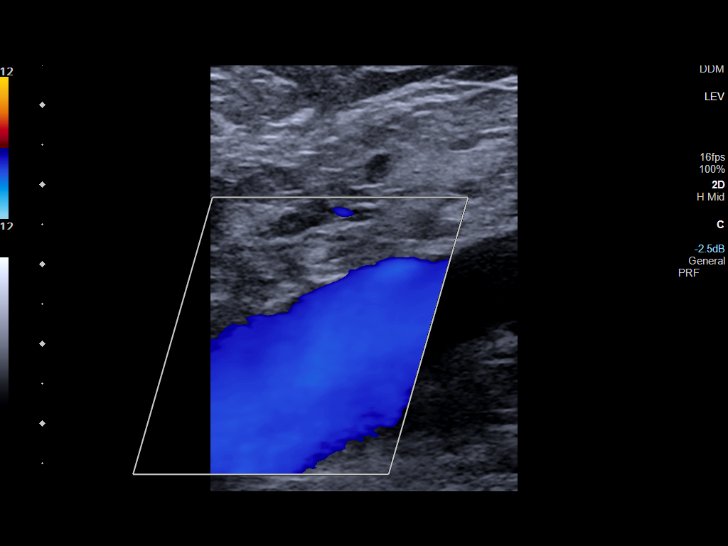
[im 10/36]
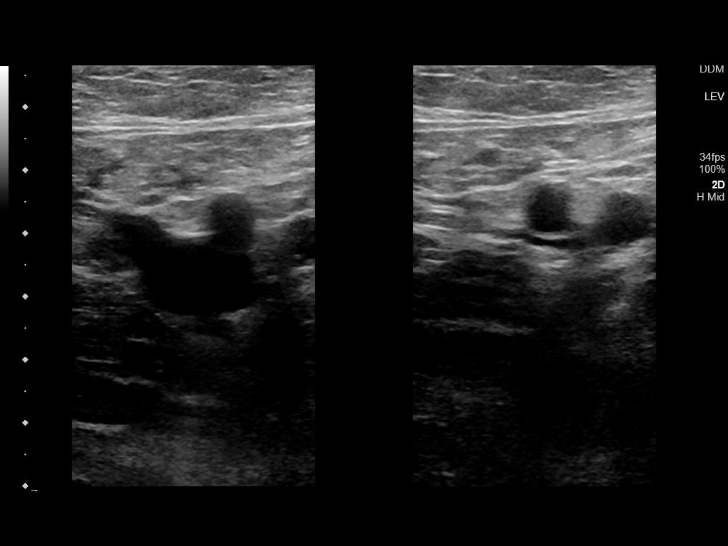
[im 11/36]
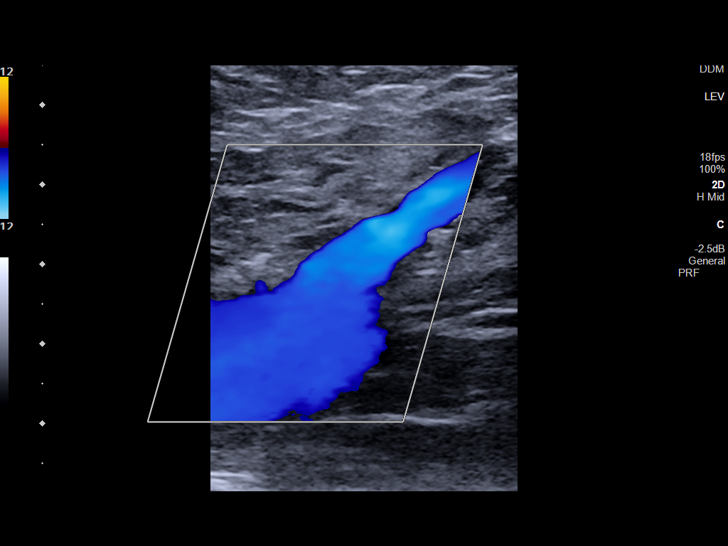
[im 14/36]
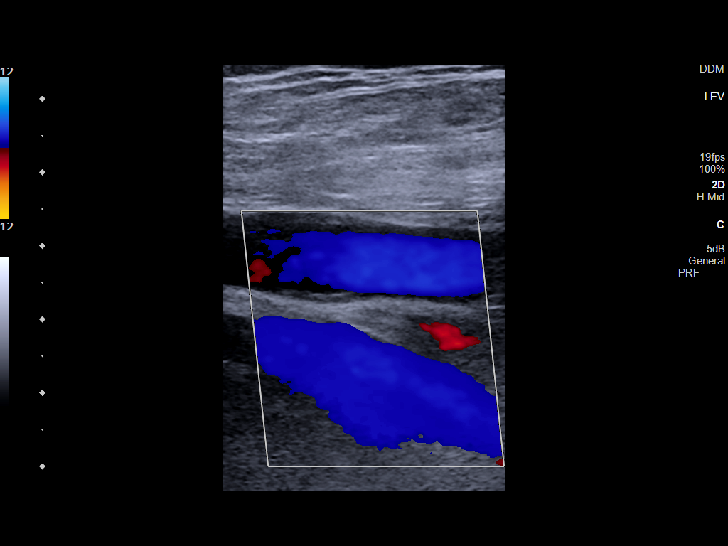
[im 17/36]
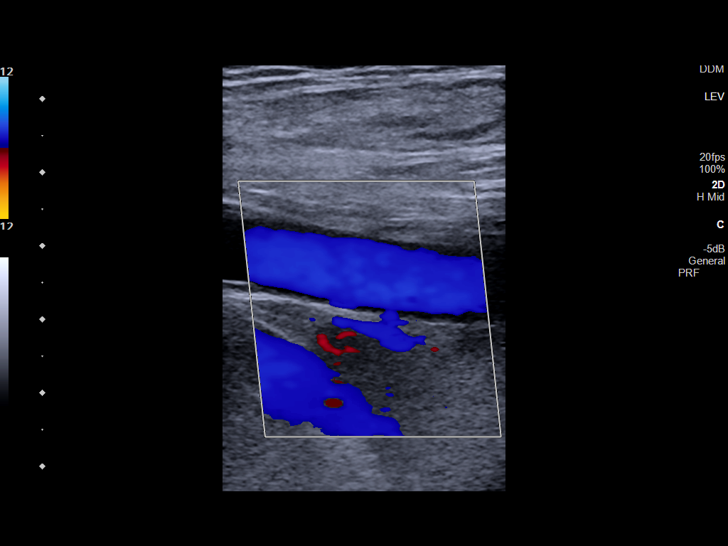
[im 19/36]
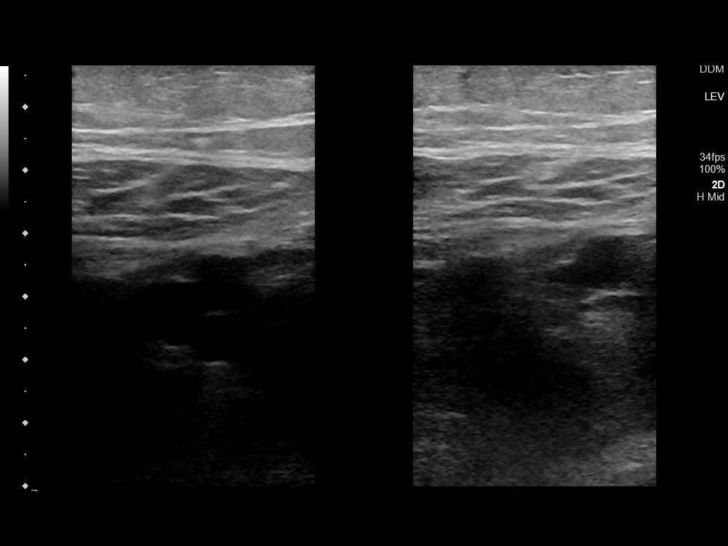
[im 22/36]
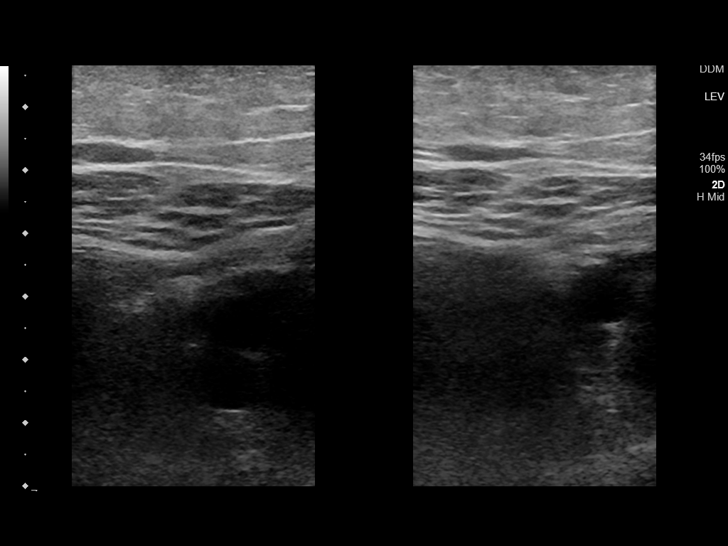
[im 25/36]
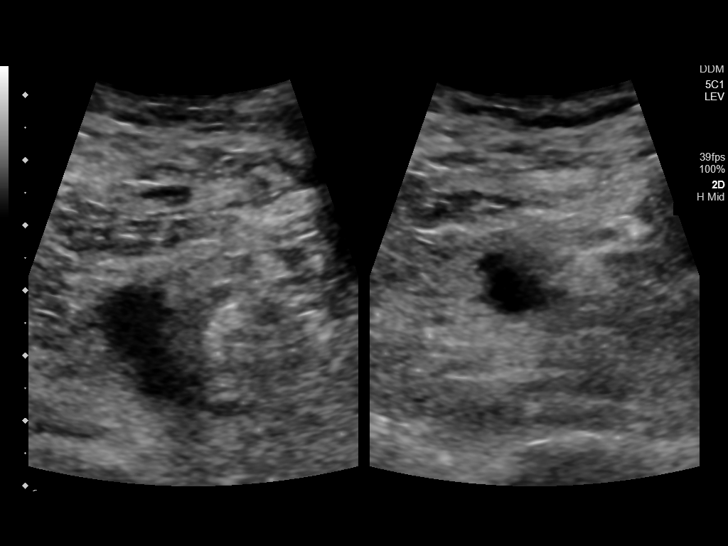
[im 28/36]
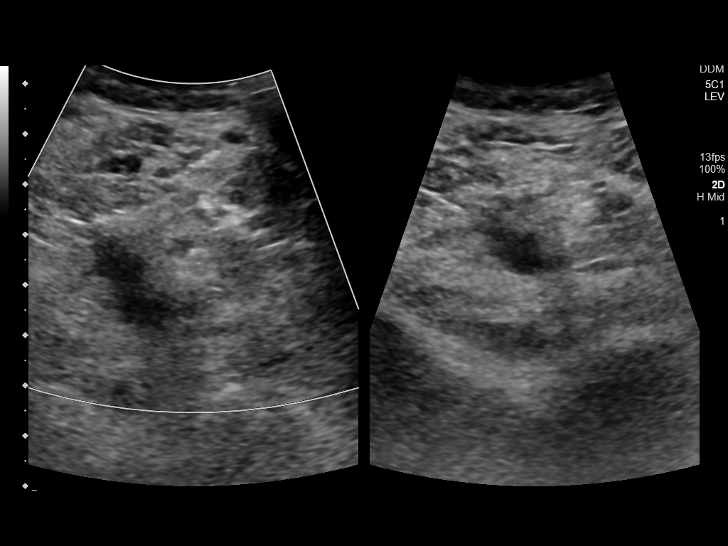
[im 29/36]
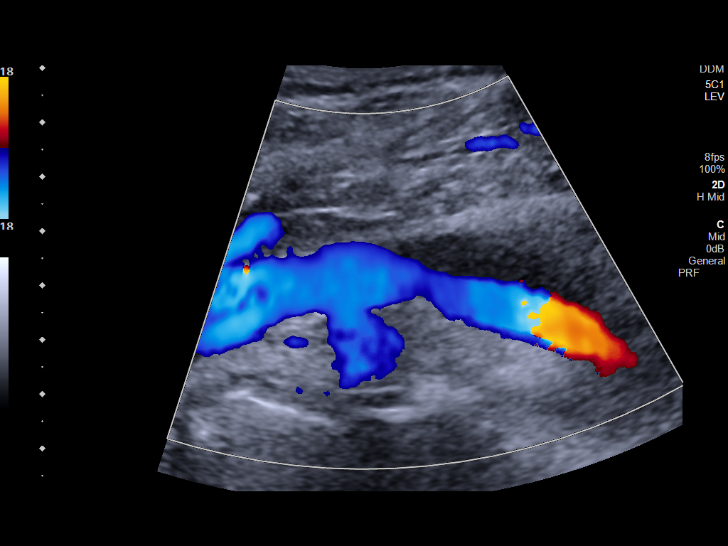
[im 32/36]
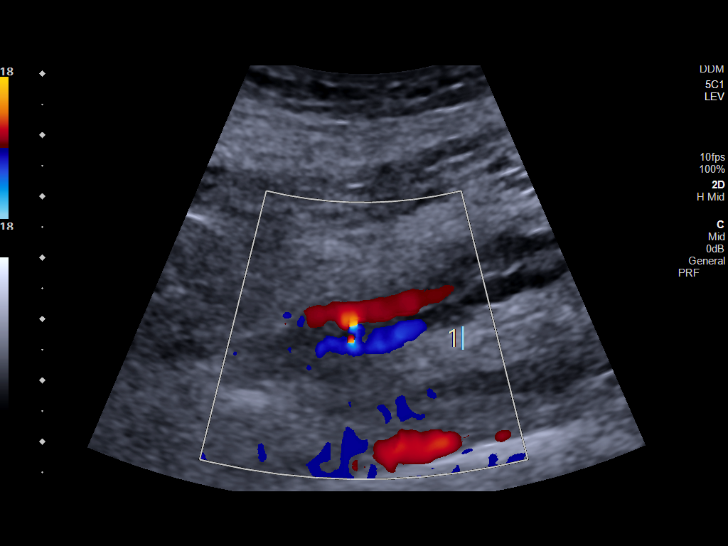
[im 36/36]
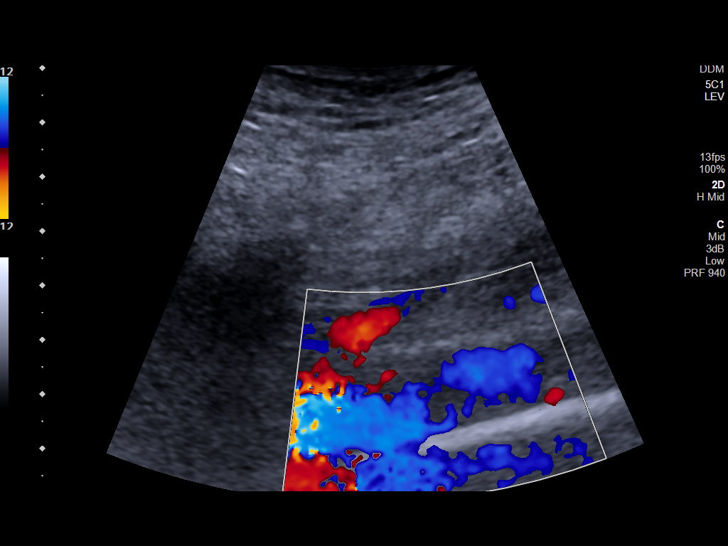

[14 of 24 positions shown; findings below may reference images not displayed]

FINDINGS: VENOUS

Normal compressibility of the common femoral, superficial femoral,
and popliteal veins, as well as the visualized calf veins.
Visualized portions of profunda femoral vein and great saphenous
vein unremarkable. No filling defects to suggest DVT on grayscale or
color Doppler imaging. Doppler waveforms show normal direction of
venous flow, normal respiratory plasticity and response to
augmentation.

Limited views of the contralateral common femoral vein are
unremarkable.

OTHER

None.

Limitations: none
IMPRESSION: No evidence of left lower extremity DVT.

## 2021-01-10 ENCOUNTER — Ambulatory Visit (INDEPENDENT_AMBULATORY_CARE_PROVIDER_SITE_OTHER): Payer: BC Managed Care – PPO | Admitting: Internal Medicine

## 2021-01-10 VITALS — BP 159/96 | HR 80 | Temp 99.1°F | Resp 18 | Ht 70.0 in | Wt 247.0 lb

## 2021-01-10 DIAGNOSIS — I10 Essential (primary) hypertension: Secondary | ICD-10-CM

## 2021-01-10 DIAGNOSIS — Z6835 Body mass index (BMI) 35.0-35.9, adult: Secondary | ICD-10-CM

## 2021-01-10 DIAGNOSIS — Z7189 Other specified counseling: Secondary | ICD-10-CM | POA: Diagnosis not present

## 2021-01-10 DIAGNOSIS — G4733 Obstructive sleep apnea (adult) (pediatric): Secondary | ICD-10-CM

## 2021-01-10 DIAGNOSIS — Z9989 Dependence on other enabling machines and devices: Secondary | ICD-10-CM

## 2021-01-10 NOTE — Progress Notes (Signed)
[facility] [place], [place] [address]  Pulmonary Sleep Medicine   Office Visit Note  Patient Name: Rodney Brooks. DOB: 1966/08/27 MRN [id number]    Chief Complaint: Obstructive Sleep Apnea visit  Brief History:  Rodney Brooks is seen today for follow up The patient has a 3 year history of sleep apnea. Patient is not using PAP nightly.  The patient feels does not feel more rested after sleeping with PAP.  The patient reports no benefit from PAP use. Reported sleepiness is  unchange and the Epworth Sleepiness Score is 3 out of 24. The patient does not  take naps. The patient complains of the following: mask moving and my wife says my mouth is opening.  The compliance download shows very poor compliance with an average use time of 3.9 hours. The AHI is 2.7  The patient does not complain of limb movements disrupting sleep.  ROS  General: (-) fever, (-) chills, (-) night sweat Nose and Sinuses: (-) nasal stuffiness or itchiness, (-) postnasal drip, (-) nosebleeds, (-) sinus trouble. Mouth and Throat: (-) sore throat, (-) hoarseness. Neck: (-) swollen glands, (-) enlarged thyroid, (-) neck pain. Respiratory: - cough, - shortness of breath, - wheezing. Neurologic: - numbness, - tingling. Psychiatric: - anxiety, - depression   Current Medication: Outpatient Encounter Medications as of 01/10/2021  Medication Sig  . ezetimibe (ZETIA) 10 MG tablet Take 10 mg by mouth daily.  . hydrochlorothiazide (MICROZIDE) 12.5 MG capsule Take 12.5 mg by mouth every morning.   Marland Kitchen lisinopril (PRINIVIL,ZESTRIL) 10 MG tablet Take 10 mg by mouth every morning.   . sildenafil (VIAGRA) 100 MG tablet Take 20 mg by mouth daily as needed for erectile dysfunction.   . simvastatin (ZOCOR) 40 MG tablet Take 40 mg by mouth every morning.   . [DISCONTINUED] apixaban (ELIQUIS) 2.5 MG TABS tablet Take 1 tablet (2.5 mg total) by mouth 2 (two) times daily.  . [DISCONTINUED] apixaban (ELIQUIS)  2.5 MG TABS tablet Take 1 tablet (2.5 mg total) by mouth 2 (two) times daily.   No facility-administered encounter medications on file as of 01/10/2021.    Surgical History: Past Surgical History:  Procedure Laterality Date  . CARPAL TUNNEL RELEASE Bilateral   . COLONOSCOPY N/A 11/22/2018   Procedure: COLONOSCOPY;  Surgeon: Danie Binder, MD;  Location: [facility];  Service: Endoscopy;  Laterality: N/A;  2:30  . 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 KNEE ARTHROSCOPY WITH MEDIAL MENISECTOMY Right 04/24/2019   Procedure: KNEE ARTHROSCOPY WITH DEBRIDEMENT AND REPAIR VS. PARTIAL MEDIAL MENISECTOMY;  Surgeon: Corky Mull, MD;  Location: [facility] ORS;  Service: Orthopedics;  Laterality: Right;  . SALIVARY STONE REMOVAL    . 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 Arthritis   . Chronic kidney disease    has one kidney  . Dysrhythmia    bundle branch block  . Hypertension   . 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 Sleep apnea    USES CPAP    Family History: Non contributory to the present illness  Social History: Social History   Socioeconomic History  . Marital status: Married    Spouse name: Not on file  .  Number of children: Not on file  . Years of education: Not on file  . Highest education level: Not on file  Occupational History  . Not on file  Tobacco Use  . Smoking status: Former Smoker    Types: Cigarettes  . Smokeless tobacco: Current User    Types: Snuff  . Tobacco comment: ONLY SMOKES SOCIALLY NOW BUT SMOKED DAILY AND QUIT IN 1997  Vaping Use  . Vaping Use: Never used  Substance and Sexual Activity  . Alcohol use: Yes    Alcohol/week: 14.0 standard drinks    Types: 14 Cans of beer per week    Comment: OCC  . Drug use: No   . Sexual activity: Not on file  Other Topics Concern  . 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 ENGAGED: [employer]   Social Determinants of Health   Financial Resource Strain: Not on file  Food Insecurity: Not on file  Transportation Needs: Not on file  Physical Activity: Not on file  Stress: Not on file  Social Connections: Not on file  Intimate Partner Violence: Not on file    Vital Signs: Blood pressure (!) 159/96, pulse 80, temperature 99.1 F (37.3 C), temperature source Temporal, resp. rate 18, height 5\' 10"  (1.778 m), weight 247 lb (112 kg), SpO2 97 %.  Examination: General Appearance: The patient is well-developed, well-nourished, and in no distress. Neck Circumference: 45 Skin: Gross inspection of skin unremarkable. Head: normocephalic, no gross deformities. Eyes: no gross deformities noted. ENT: ears appear grossly normal Neurologic: Alert and oriented. No involuntary movements.    EPWORTH SLEEPINESS SCALE:  Scale:  (0)= no chance of dozing; (1)= slight chance of dozing; (2)= moderate chance of dozing; (3)= high chance of dozing  Chance  Situtation    Sitting and reading: 0    Watching TV: 2    Sitting Inactive in public: 0    As a passenger in car: 0      Lying down to rest: 1    Sitting and talking: 0    Sitting quielty after lunch: 0    In a car, stopped in traffic: 0   TOTAL SCORE:   3 out of 24    SLEEP STUDIES:  1. HST - AHI 29.1, Prone 32.9,, Low Spo2 78%   CPAP COMPLIANCE DATA:  Date Range: 01/10/20 - 01/08/21  Average Daily Use: 1:17 hours  Median Use: 3:53 HOURS  Compliance for > 4 Hours: 15%  AHI: 2.7 respiratory events per hour  Days Used: 119/365 days   Mask Leak: 22.7 lpm  95th Percentile Pressure: 6 cmH2)         LABS: No results found for this or any previous visit (from the past 2160  hour(s)).  Radiology: US Venous Img Lower Unilateral Left  Result Date: 09/13/2020 CLINICAL DATA:  Left calf pain EXAM: LEFT LOWER EXTREMITY VENOUS DOPPLER ULTRASOUND TECHNIQUE: Gray-scale sonography with compression, as well as color and duplex ultrasound, were performed to evaluate the deep venous system(s) from the level of the common femoral vein through the popliteal and proximal calf veins. COMPARISON:  None. FINDINGS: VENOUS Normal compressibility of the common femoral, superficial femoral, and popliteal veins, as well as the visualized calf veins. Visualized portions of profunda femoral vein and great saphenous vein unremarkable. No filling defects to suggest DVT on grayscale or color Doppler imaging. Doppler waveforms show normal direction of venous flow,  normal respiratory plasticity and response to augmentation. Limited views of the contralateral common femoral vein are unremarkable. OTHER None. Limitations: none IMPRESSION: No evidence of left lower extremity DVT. Electronically Signed   By: Macy Mis M.D.   On: 09/13/2020 13:51    No results found.  No results found.    Assessment and Plan: Patient Active Problem List   Diagnosis Date Noted  . Class 2 severe obesity with serious comorbidity and body mass index (BMI) of 35.0 to 35.9 in adult [facility]) 01/10/2021  . Hypertension 01/10/2021  . OSA on CPAP 10/11/2020  . CPAP use counseling 10/11/2020  . Special screening for malignant neoplasms, colon     1. OSA on CPAP The patient does tolerate PAP but does notreport benefit from PAP use.He tried being more compliant with treatment then the connector at the back broke. The morbidity associated with untreated sleep apnea was discussed and the patient was advised to use CPAP all night every night.   The patient was reminded how to clean and advised to get a chin stap.He needs a new fitting for the back of his machine and was advised to replace his supplies more frequently He will  try to use the CPAP nightly. The compliance is very poor. The apnea is well controlled. OSA- use CPAP nightly    2. CPAP use counseling CPAP Counseling: had a lengthy discussion with the patient regarding the importance of PAP therapy in management of the sleep apnea. Patient appears to understand the risk factor reduction and also understands the risks associated with untreated sleep apnea. Patient will try to make a good faith effort to remain compliant with therapy. Also instructed the patient on proper cleaning of the device including the water must be changed daily if possible and use of distilled water is preferred. Patient understands that the machine should be regularly cleaned with appropriate recommended cleaning solutions that do not damage the PAP machine for example given white vinegar and water rinses. Other methods such as ozone treatment may not be as good as these simple methods to achieve cleaning.  3. Hypertension, unspecified type Elevated bp today, asymptomatic, counseled as to monitoring and f/u with PCP. Hypertension Counseling:   The following hypertensive lifestyle modification were recommended and discussed:  1. Limiting alcohol intake to less than 1 oz/day of ethanol:(24 oz of beer or 8 oz of wine or 2 oz of 100-proof whiskey). 2. Take baby ASA 81 mg daily. 3. Importance of regular aerobic exercise and losing weight. 4. Reduce dietary saturated fat and cholesterol intake for overall cardiovascular health. 5. Maintaining adequate dietary potassium, calcium, and magnesium intake. 6. Regular monitoring of the blood pressure. 7. Reduce sodium intake to less than 100 mmol/day (less than 2.3 gm of sodium or less than 6 gm of sodium choride)   4. Class 2 severe obesity with serious comorbidity and body mass index (BMI) of 35.0 to 35.9 in adult, unspecified obesity type ([place]) Obesity Counseling: Had a lengthy discussion regarding patients BMI and weight issues. Patient was  instructed on portion control as well as increased activity. Also discussed caloric restrictions with trying to maintain intake less than 2000 Kcal. Discussions were made in accordance with the 5As of weight management. Simple actions such as not eating late and if able to, taking a walk is suggested.   General Counseling: I have discussed the findings of the evaluation and examination with Rodney Brooks.  I have also discussed any further diagnostic evaluation thatmay be needed or  ordered today. Narayan verbalizes understanding of the findings of todays visit. We also reviewed his medications today and discussed drug interactions and side effects including but not limited excessive drowsiness and altered mental states. We also discussed that there is always a risk not just to him but also people around him. he has been encouraged to call the office with any questions or concerns that should arise related to todays visit.  No orders of the defined types were placed in this encounter.       I have personally obtained a history, examined the patient, evaluated laboratory and imaging results, formulated the assessment and plan and placed orders.  This patient was seen today by Tressie Ellis, PA-C in collaboration with Dr. Devona Konig.  Richelle Ito Saunders Glance, PhD, FAASM  Diplomate, American Board of Sleep Medicine    Allyne Gee, MD [facility] Diplomate ABMS Pulmonary and Critical Care Medicine Sleep medicine

## 2021-01-10 NOTE — Patient Instructions (Signed)

## 2021-03-02 ENCOUNTER — Other Ambulatory Visit: Payer: Self-pay | Admitting: Family Medicine

## 2021-03-02 DIAGNOSIS — D179 Benign lipomatous neoplasm, unspecified: Secondary | ICD-10-CM

## 2021-03-02 NOTE — Progress Notes (Signed)
referr

## 2021-03-02 NOTE — Addendum Note (Signed)
Addended by: Shary Decamp B on: 03/02/2021 03:35 PM   Modules accepted: Orders

## 2021-03-22 ENCOUNTER — Ambulatory Visit: Payer: Self-pay | Admitting: General Surgery

## 2021-03-31 ENCOUNTER — Ambulatory Visit: Payer: Self-pay | Admitting: General Surgery

## 2021-04-26 ENCOUNTER — Ambulatory Visit: Payer: Self-pay | Admitting: General Surgery

## 2023-04-06 ENCOUNTER — Other Ambulatory Visit: Payer: Self-pay | Admitting: Surgery

## 2023-04-06 DIAGNOSIS — M1711 Unilateral primary osteoarthritis, right knee: Secondary | ICD-10-CM

## 2023-04-06 DIAGNOSIS — S83231D Complex tear of medial meniscus, current injury, right knee, subsequent encounter: Secondary | ICD-10-CM

## 2023-04-21 ENCOUNTER — Ambulatory Visit
Admission: RE | Admit: 2023-04-21 | Discharge: 2023-04-21 | Disposition: A | Discharge status: Home / Self Care | Payer: Managed Care, Other (non HMO) | Source: Ambulatory Visit | Attending: Surgery | Admitting: Surgery

## 2023-04-21 DIAGNOSIS — M1711 Unilateral primary osteoarthritis, right knee: Secondary | ICD-10-CM

## 2023-04-21 DIAGNOSIS — S83231D Complex tear of medial meniscus, current injury, right knee, subsequent encounter: Secondary | ICD-10-CM

## 2023-05-09 ENCOUNTER — Other Ambulatory Visit: Payer: Self-pay | Admitting: Surgery

## 2023-05-09 ENCOUNTER — Encounter
Admission: RE | Admit: 2023-05-09 | Discharge: 2023-05-09 | Disposition: A | Discharge status: Home / Self Care | Payer: Managed Care, Other (non HMO) | Source: Ambulatory Visit | Attending: Surgery | Admitting: Surgery

## 2023-05-09 VITALS — Ht 70.0 in | Wt 247.8 lb

## 2023-05-09 DIAGNOSIS — Z01812 Encounter for preprocedural laboratory examination: Secondary | ICD-10-CM

## 2023-05-09 DIAGNOSIS — I10 Essential (primary) hypertension: Secondary | ICD-10-CM

## 2023-05-09 DIAGNOSIS — Z01810 Encounter for preprocedural cardiovascular examination: Secondary | ICD-10-CM

## 2023-05-09 HISTORY — DX: Obstructive sleep apnea (adult) (pediatric): G47.33

## 2023-05-09 HISTORY — DX: Hyperlipidemia, unspecified: E78.5

## 2023-05-09 HISTORY — DX: Unspecified right bundle-branch block: I45.10

## 2023-05-09 HISTORY — DX: Other tear of medial meniscus, current injury, unspecified knee, initial encounter: S83.249A

## 2023-05-09 HISTORY — DX: Obesity, unspecified: E66.9

## 2023-05-09 NOTE — Patient Instructions (Signed)
Your procedure is scheduled on:05-15-23 Tuesday Report to the Registration Desk on the 1st floor of the [facility].Then proceed to the 2nd floor Surgery Desk To find out your arrival time, please call [phone] between 1PM - 3PM on:05-14-23 Monday If your arrival time is 6:00 am, do not arrive before that time as the [facility] entrance doors do not open until 6:00 am.  REMEMBER: Instructions that are not followed completely may result in serious medical risk, up to and including death; or upon the discretion of your surgeon and anesthesiologist your surgery may need to be rescheduled.  Do not eat food after midnight the night before surgery.  No gum chewing or hard candies.  You may however, drink CLEAR liquids up to 2 hours before you are scheduled to arrive for your surgery. Do not drink anything within 2 hours of your scheduled arrival time.  Clear liquids include: - water  - apple juice without pulp - gatorade (not RED colors) - black coffee or tea (Do NOT add milk or creamers to the coffee or tea) Do NOT drink anything that is not on this list.  In addition, your doctor has ordered for you to drink the provided:  Ensure Pre-Surgery Clear Carbohydrate Drink  Drinking this carbohydrate drink up to two hours before surgery helps to reduce insulin resistance and improve patient outcomes. Please complete drinking 2 hours before scheduled arrival time.  One week prior to surgery:Last dose 05-09-23 Stop Anti-inflammatories (NSAIDS) such as Advil, Aleve, Ibuprofen, Motrin, Naproxen, Naprosyn and Aspirin based products such as Excedrin, Goody's Powder, BC Powder.You may however, take Tylenol if needed for pain up until the day of surgery. Stop ANY OVER THE COUNTER supplements/vitamins NOW (05-09-23) until after surgery.   Continue taking all prescribed medications with the exception of the following: -sildenafil (VIAGRA)-Stop 2 days prior to surgery-Last dose will be on 05-12-23  Saturday  TAKE ONLY THESE MEDICATIONS THE MORNING OF SURGERY WITH A SIP OF WATER: -ezetimibe (ZETIA)  -simvastatin (ZOCOR)   No Alcohol for 24 hours before or after surgery.  No Smoking including e-cigarettes for 24 hours before surgery.  No chewable tobacco products for at least 6 hours before surgery.  No nicotine patches on the day of surgery.  Do not use any "recreational" drugs for at least a week (preferably 2 weeks) before your surgery.  Please be advised that the combination of cocaine and anesthesia may have negative outcomes, up to and including death. If you test positive for cocaine, your surgery will be cancelled.  On the morning of surgery brush your teeth with toothpaste and water, you may rinse your mouth with mouthwash if you wish. Do not swallow any toothpaste or mouthwash.  Use CHG Soap as directed on instruction sheet.  Do not wear jewelry, make-up, hairpins, clips or nail polish.  Do not wear lotions, powders, or perfumes.   Do not shave body hair from the neck down 48 hours before surgery.  Contact lenses, hearing aids and dentures may not be worn into surgery.  Do not bring valuables to the hospital. [facility] is not responsible for any missing/lost belongings or valuables.   Bring your C-PAP to the hospital   Notify your doctor if there is any change in your medical condition (cold, fever, infection).  Wear comfortable clothing (specific to your surgery type) to the hospital.  After surgery, you can help prevent lung complications by doing breathing exercises.  Take deep breaths and cough every 1-2 hours. Your  doctor may order a device called an Incentive Spirometer to help you take deep breaths. When coughing or sneezing, hold a pillow firmly against your incision with both hands. This is called "splinting." Doing this helps protect your incision. It also decreases belly discomfort.  If you are being admitted to the hospital overnight, leave your  suitcase in the car. After surgery it may be brought to your room.  In case of increased patient census, it may be necessary for you, the patient, to continue your postoperative care in the Same Day Surgery department.  If you are being discharged the day of surgery, you will not be allowed to drive home. You will need a responsible individual to drive you home and stay with you for 24 hours after surgery.   If you are taking public transportation, you will need to have a responsible individual with you.  Please call the Pre-admissions Testing Dept. at [phone] if you have any questions about these instructions.  Surgery Visitation Policy:  Patients having surgery or a procedure may have two visitors.  Children under the age of 51 must have an adult with them who is not the patient.     Preparing for Surgery with CHLORHEXIDINE GLUCONATE (CHG) Soap  Chlorhexidine Gluconate (CHG) Soap  o An antiseptic cleaner that kills germs and bonds with the skin to continue killing germs even after washing  o Used for showering the night before surgery and morning of surgery  Before surgery, you can play an important role by reducing the number of germs on your skin.  CHG (Chlorhexidine gluconate) soap is an antiseptic cleanser which kills germs and bonds with the skin to continue killing germs even after washing.  Please do not use if you have an allergy to CHG or antibacterial soaps. If your skin becomes reddened/irritated stop using the CHG.  1. Shower the NIGHT BEFORE SURGERY and the MORNING OF SURGERY with CHG soap.  2. If you choose to wash your hair, wash your hair first as usual with your normal shampoo.  3. After shampooing, rinse your hair and body thoroughly to remove the shampoo.  4. Use CHG as you would any other liquid soap. You can apply CHG directly to the skin and wash gently with a scrungie or a clean washcloth.  5. Apply the CHG soap to your body only from the neck  down. Do not use on open wounds or open sores. Avoid contact with your eyes, ears, mouth, and genitals (private parts). Wash face and genitals (private parts) with your normal soap.  6. Wash thoroughly, paying special attention to the area where your surgery will be performed.  7. Thoroughly rinse your body with warm water.  8. Do not shower/wash with your normal soap after using and rinsing off the CHG soap.  9. Pat yourself dry with a clean towel.  10. Wear clean pajamas to bed the night before surgery.  12. Place clean sheets on your bed the night of your first shower and do not sleep with pets.  13. Shower again with the CHG soap on the day of surgery prior to arriving at the hospital.  14. Do not apply any deodorants/lotions/powders.  15. Please wear clean clothes to the hospital.  How to Use an Incentive Spirometer An incentive spirometer is a tool that measures how well you are filling your lungs with each breath. Learning to take long, deep breaths using this tool can help you keep your lungs clear and active.  This may help to reverse or lessen your chance of developing breathing (pulmonary) problems, especially infection. You may be asked to use a spirometer: After a surgery. If you have a lung problem or a history of smoking. After a long period of time when you have been unable to move or be active. If the spirometer includes an indicator to show the highest number that you have reached, your health care provider or respiratory therapist will help you set a goal. Keep a log of your progress as told by your health care provider. What are the risks? Breathing too quickly may cause dizziness or cause you to pass out. Take your time so you do not get dizzy or light-headed. If you are in pain, you may need to take pain medicine before doing incentive spirometry. It is harder to take a deep breath if you are having pain. How to use your incentive spirometer  Sit up on the edge of  your bed or on a chair. Hold the incentive spirometer so that it is in an upright position. Before you use the spirometer, breathe out normally. Place the mouthpiece in your mouth. Make sure your lips are closed tightly around it. Breathe in slowly and as deeply as you can through your mouth, causing the piston or the ball to rise toward the top of the chamber. Hold your breath for 3-5 seconds, or for as long as possible. If the spirometer includes a coach indicator, use this to guide you in breathing. Slow down your breathing if the indicator goes above the marked areas. Remove the mouthpiece from your mouth and breathe out normally. The piston or ball will return to the bottom of the chamber. Rest for a few seconds, then repeat the steps 10 or more times. Take your time and take a few normal breaths between deep breaths so that you do not get dizzy or light-headed. Do this every 1-2 hours when you are awake. If the spirometer includes a goal marker to show the highest number you have reached (best effort), use this as a goal to work toward during each repetition. After each set of 10 deep breaths, cough a few times. This will help to make sure that your lungs are clear. If you have an incision on your chest or abdomen from surgery, place a pillow or a rolled-up towel firmly against the incision when you cough. This can help to reduce pain while taking deep breaths and coughing. General tips When you are able to get out of bed: Walk around often. Continue to take deep breaths and cough in order to clear your lungs. Keep using the incentive spirometer until your health care provider says it is okay to stop using it. If you have been in the hospital, you may be told to keep using the spirometer at home. Contact a health care provider if: You are having difficulty using the spirometer. You have trouble using the spirometer as often as instructed. Your pain medicine is not giving enough relief for  you to use the spirometer as told. You have a fever. Get help right away if: You develop shortness of breath. You develop a cough with bloody mucus from the lungs. You have fluid or blood coming from an incision site after you cough. Summary An incentive spirometer is a tool that can help you learn to take long, deep breaths to keep your lungs clear and active. You may be asked to use a spirometer after a surgery, if you  have a lung problem or a history of smoking, or if you have been inactive for a long period of time. Use your incentive spirometer as instructed every 1-2 hours while you are awake. If you have an incision on your chest or abdomen, place a pillow or a rolled-up towel firmly against your incision when you cough. This will help to reduce pain. Get help right away if you have shortness of breath, you cough up bloody mucus, or blood comes from your incision when you cough. This information is not intended to replace advice given to you by your health care provider. Make sure you discuss any questions you have with your health care provider. Document Revised: 12/08/2019 Document Reviewed: 12/08/2019 Elsevier Patient Education  2024 [employer].

## 2023-05-10 ENCOUNTER — Encounter
Admission: RE | Admit: 2023-05-10 | Discharge: 2023-05-10 | Disposition: A | Discharge status: Home / Self Care | Payer: Managed Care, Other (non HMO) | Source: Ambulatory Visit | Attending: Surgery | Admitting: Surgery

## 2023-05-10 DIAGNOSIS — Z01810 Encounter for preprocedural cardiovascular examination: Secondary | ICD-10-CM | POA: Insufficient documentation

## 2023-05-10 DIAGNOSIS — Z01818 Encounter for other preprocedural examination: Secondary | ICD-10-CM | POA: Diagnosis present

## 2023-05-10 DIAGNOSIS — Z01812 Encounter for preprocedural laboratory examination: Secondary | ICD-10-CM | POA: Diagnosis not present

## 2023-05-10 DIAGNOSIS — I4510 Unspecified right bundle-branch block: Secondary | ICD-10-CM | POA: Diagnosis not present

## 2023-05-10 DIAGNOSIS — I10 Essential (primary) hypertension: Secondary | ICD-10-CM | POA: Diagnosis not present

## 2023-05-10 LAB — CBC
HCT: 44.9 % (ref 39.0–52.0)
Hemoglobin: 15.8 g/dL (ref 13.0–17.0)
MCH: 33.5 pg (ref 26.0–34.0)
MCHC: 35.2 g/dL (ref 30.0–36.0)
MCV: 95.1 fL (ref 80.0–100.0)
Platelets: 294 10*3/uL (ref 150–400)
RBC: 4.72 MIL/uL (ref 4.22–5.81)
RDW: 12.4 % (ref 11.5–15.5)
WBC: 9 10*3/uL (ref 4.0–10.5)
nRBC: 0 % (ref 0.0–0.2)

## 2023-05-10 LAB — BASIC METABOLIC PANEL
Anion gap: 12 (ref 5–15)
BUN: 21 mg/dL — ABNORMAL HIGH (ref 6–20)
CO2: 26 mmol/L (ref 22–32)
Calcium: 9.4 mg/dL (ref 8.9–10.3)
Chloride: 100 mmol/L (ref 98–111)
Creatinine, Ser: 1.13 mg/dL (ref 0.61–1.24)
GFR, Estimated: >60 mL/min (ref >60)
Glucose, Bld: 91 mg/dL (ref 70–99)
Potassium: 3.5 mmol/L (ref 3.5–5.1)
Sodium: 138 mmol/L (ref 135–145)

## 2023-05-14 MED ORDER — FAMOTIDINE 20 MG PO TABS
20.0000 mg | ORAL_TABLET | Freq: Once | ORAL | Status: AC
  Administered 2023-05-15: 20 mg via ORAL

## 2023-05-14 MED ORDER — CEFAZOLIN SODIUM-DEXTROSE 2-4 GM/100ML-% IV SOLN: 2.0000 g | INTRAVENOUS | Status: DC

## 2023-05-14 MED ORDER — LACTATED RINGERS IV SOLN: INTRAVENOUS | Status: DC

## 2023-05-14 MED ORDER — CHLORHEXIDINE GLUCONATE 0.12 % MT SOLN
15.0000 mL | Freq: Once | OROMUCOSAL | Status: AC
  Administered 2023-05-15: 15 mL via OROMUCOSAL

## 2023-05-14 MED ORDER — ORAL CARE MOUTH RINSE: 15.0000 mL | Freq: Once | OROMUCOSAL | Status: AC

## 2023-05-15 ENCOUNTER — Encounter: Payer: Self-pay | Admitting: Surgery

## 2023-05-15 ENCOUNTER — Ambulatory Visit
Admission: RE | Admit: 2023-05-15 | Discharge: 2023-05-15 | Disposition: A | Discharge status: Home / Self Care | Payer: Managed Care, Other (non HMO) | Source: Ambulatory Visit | Attending: Surgery | Admitting: Surgery

## 2023-05-15 ENCOUNTER — Other Ambulatory Visit: Payer: Self-pay

## 2023-05-15 ENCOUNTER — Ambulatory Visit: Payer: Managed Care, Other (non HMO) | Admitting: Certified Registered"

## 2023-05-15 ENCOUNTER — Ambulatory Visit: Payer: Managed Care, Other (non HMO) | Admitting: Urgent Care

## 2023-05-15 ENCOUNTER — Encounter
Admission: RE | Disposition: A | Discharge status: Home / Self Care | Payer: Self-pay | Source: Ambulatory Visit | Attending: Surgery

## 2023-05-15 DIAGNOSIS — X58XXXA Exposure to other specified factors, initial encounter: Secondary | ICD-10-CM | POA: Diagnosis not present

## 2023-05-15 DIAGNOSIS — S83231A Complex tear of medial meniscus, current injury, right knee, initial encounter: Secondary | ICD-10-CM | POA: Insufficient documentation

## 2023-05-15 DIAGNOSIS — M1711 Unilateral primary osteoarthritis, right knee: Secondary | ICD-10-CM | POA: Diagnosis not present

## 2023-05-15 HISTORY — PX: KNEE ARTHROSCOPY: SHX127

## 2023-05-15 SURGERY — ARTHROSCOPY, KNEE
Anesthesia: General | Site: Knee | Laterality: Right

## 2023-05-15 MED ORDER — KETOROLAC TROMETHAMINE 15 MG/ML IJ SOLN
15.0000 mg | Freq: Once | INTRAMUSCULAR | Status: AC
  Administered 2023-05-15: 15 mg via INTRAVENOUS

## 2023-05-15 MED ORDER — BUPIVACAINE-EPINEPHRINE 0.5% -1:200000 IJ SOLN
INTRAMUSCULAR | Status: DC | PRN
  Administered 2023-05-15: 30 mL

## 2023-05-15 MED ORDER — OXYCODONE HCL 5 MG PO TABS: 5.0000 mg | ORAL_TABLET | ORAL | Status: DC | PRN

## 2023-05-15 MED ORDER — PROPOFOL 1000 MG/100ML IV EMUL
INTRAVENOUS | Status: AC
  Filled 2023-05-15: qty 100

## 2023-05-15 MED ORDER — DROPERIDOL 2.5 MG/ML IJ SOLN: 0.6250 mg | Freq: Once | INTRAMUSCULAR | Status: DC | PRN

## 2023-05-15 MED ORDER — KETOROLAC TROMETHAMINE 30 MG/ML IJ SOLN: 30.0000 mg | Freq: Once | INTRAMUSCULAR | Status: DC

## 2023-05-15 MED ORDER — CHLORHEXIDINE GLUCONATE 0.12 % MT SOLN
OROMUCOSAL | Status: AC
  Filled 2023-05-15: qty 15

## 2023-05-15 MED ORDER — OXYCODONE HCL 5 MG PO TABS
5.0000 mg | ORAL_TABLET | Freq: Once | ORAL | Status: AC | PRN
  Administered 2023-05-15: 5 mg via ORAL

## 2023-05-15 MED ORDER — EPINEPHRINE PF 1 MG/ML IJ SOLN
INTRAMUSCULAR | Status: AC
  Filled 2023-05-15: qty 1

## 2023-05-15 MED ORDER — METOCLOPRAMIDE HCL 5 MG/ML IJ SOLN: 5.0000 mg | Freq: Three times a day (TID) | INTRAMUSCULAR | Status: DC | PRN

## 2023-05-15 MED ORDER — PROPOFOL 10 MG/ML IV BOLUS
INTRAVENOUS | Status: DC | PRN
Start: 2023-05-15 — End: 2023-05-15
  Administered 2023-05-15: 100 ug/kg/min via INTRAVENOUS
  Administered 2023-05-15: 200 mg via INTRAVENOUS

## 2023-05-15 MED ORDER — BUPIVACAINE HCL (PF) 0.5 % IJ SOLN
INTRAMUSCULAR | Status: AC
  Filled 2023-05-15: qty 60

## 2023-05-15 MED ORDER — ACETAMINOPHEN 500 MG PO TABS
ORAL_TABLET | ORAL | Status: AC
  Filled 2023-05-15: qty 2

## 2023-05-15 MED ORDER — LIDOCAINE HCL (PF) 1 % IJ SOLN
INTRAMUSCULAR | Status: AC
  Filled 2023-05-15: qty 30

## 2023-05-15 MED ORDER — OXYCODONE HCL 5 MG PO TABS: 5.0000 mg | ORAL_TABLET | ORAL | 0 refills | Status: AC | PRN

## 2023-05-15 MED ORDER — PHENYLEPHRINE HCL (PRESSORS) 10 MG/ML IV SOLN
INTRAVENOUS | Status: DC | PRN
  Administered 2023-05-15: 160 ug via INTRAVENOUS

## 2023-05-15 MED ORDER — FAMOTIDINE 20 MG PO TABS
ORAL_TABLET | ORAL | Status: AC
  Filled 2023-05-15: qty 1

## 2023-05-15 MED ORDER — KETOROLAC TROMETHAMINE 15 MG/ML IJ SOLN
INTRAMUSCULAR | Status: AC
  Filled 2023-05-15: qty 1

## 2023-05-15 MED ORDER — ACETAMINOPHEN 10 MG/ML IV SOLN: 1000.0000 mg | Freq: Once | INTRAVENOUS | Status: DC | PRN

## 2023-05-15 MED ORDER — EPINEPHRINE PF 1 MG/ML IJ SOLN
INTRAMUSCULAR | Status: AC
  Filled 2023-05-15: qty 2

## 2023-05-15 MED ORDER — METOCLOPRAMIDE HCL 10 MG PO TABS: 5.0000 mg | ORAL_TABLET | Freq: Three times a day (TID) | ORAL | Status: DC | PRN

## 2023-05-15 MED ORDER — FENTANYL CITRATE (PF) 100 MCG/2ML IJ SOLN
INTRAMUSCULAR | Status: AC
  Filled 2023-05-15: qty 2

## 2023-05-15 MED ORDER — MIDAZOLAM HCL 2 MG/2ML IJ SOLN
INTRAMUSCULAR | Status: AC
  Filled 2023-05-15: qty 2

## 2023-05-15 MED ORDER — CEFAZOLIN SODIUM-DEXTROSE 2-4 GM/100ML-% IV SOLN
INTRAVENOUS | Status: AC
  Filled 2023-05-15: qty 100

## 2023-05-15 MED ORDER — GABAPENTIN 300 MG PO CAPS
ORAL_CAPSULE | ORAL | Status: AC
  Filled 2023-05-15: qty 1

## 2023-05-15 MED ORDER — FENTANYL CITRATE (PF) 100 MCG/2ML IJ SOLN: 25.0000 ug | INTRAMUSCULAR | Status: DC | PRN

## 2023-05-15 MED ORDER — OXYCODONE HCL 5 MG/5ML PO SOLN: 5.0000 mg | Freq: Once | ORAL | Status: AC | PRN

## 2023-05-15 MED ORDER — PROMETHAZINE HCL 25 MG/ML IJ SOLN: 6.2500 mg | INTRAMUSCULAR | Status: DC | PRN

## 2023-05-15 MED ORDER — OXYCODONE HCL 5 MG PO TABS
ORAL_TABLET | ORAL | Status: AC
  Filled 2023-05-15: qty 1

## 2023-05-15 MED ORDER — ACETAMINOPHEN 325 MG PO TABS: 325.0000 mg | ORAL_TABLET | Freq: Four times a day (QID) | ORAL | Status: DC | PRN

## 2023-05-15 MED ORDER — CELECOXIB 200 MG PO CAPS
200.0000 mg | ORAL_CAPSULE | Freq: Once | ORAL | Status: DC
Start: 2023-05-15 — End: 2023-05-15

## 2023-05-15 MED ORDER — ONDANSETRON HCL 4 MG/2ML IJ SOLN: 4.0000 mg | Freq: Four times a day (QID) | INTRAMUSCULAR | Status: DC | PRN

## 2023-05-15 MED ORDER — LIDOCAINE HCL (CARDIAC) PF 100 MG/5ML IV SOSY
PREFILLED_SYRINGE | INTRAVENOUS | Status: DC | PRN
  Administered 2023-05-15: 100 mg via INTRAVENOUS

## 2023-05-15 MED ORDER — FENTANYL CITRATE (PF) 100 MCG/2ML IJ SOLN
INTRAMUSCULAR | Status: DC | PRN
  Administered 2023-05-15 (×2): 50 ug via INTRAVENOUS

## 2023-05-15 MED ORDER — RINGERS IRRIGATION IR SOLN
Status: DC | PRN
  Administered 2023-05-15: 2600 mL

## 2023-05-15 MED ORDER — MIDAZOLAM HCL 2 MG/2ML IJ SOLN
INTRAMUSCULAR | Status: DC | PRN
  Administered 2023-05-15: 2 mg via INTRAVENOUS

## 2023-05-15 MED ORDER — ACETAMINOPHEN 500 MG PO TABS
1000.0000 mg | ORAL_TABLET | Freq: Once | ORAL | Status: AC
  Administered 2023-05-15: 1000 mg via ORAL

## 2023-05-15 MED ORDER — DEXMEDETOMIDINE HCL IN NACL 200 MCG/50ML IV SOLN
INTRAVENOUS | Status: DC | PRN
  Administered 2023-05-15 (×3): 4 ug via INTRAVENOUS
  Administered 2023-05-15: 8 ug via INTRAVENOUS

## 2023-05-15 MED ORDER — ONDANSETRON HCL 4 MG/2ML IJ SOLN
INTRAMUSCULAR | Status: DC | PRN
  Administered 2023-05-15: 4 mg via INTRAVENOUS

## 2023-05-15 MED ORDER — LIDOCAINE HCL (PF) 1 % IJ SOLN
INTRAMUSCULAR | Status: DC | PRN
  Administered 2023-05-15: 60 mL via SURGICAL_CAVITY

## 2023-05-15 MED ORDER — GABAPENTIN 100 MG PO CAPS
300.0000 mg | ORAL_CAPSULE | Freq: Once | ORAL | Status: AC
  Administered 2023-05-15: 300 mg via ORAL

## 2023-05-15 MED ORDER — ONDANSETRON HCL 4 MG PO TABS: 4.0000 mg | ORAL_TABLET | Freq: Four times a day (QID) | ORAL | Status: DC | PRN

## 2023-05-15 MED ORDER — DEXAMETHASONE SODIUM PHOSPHATE 10 MG/ML IJ SOLN
INTRAMUSCULAR | Status: DC | PRN
  Administered 2023-05-15: 10 mg via INTRAVENOUS

## 2023-05-15 SURGICAL SUPPLY — 43 items
APL PRP STRL LF DISP 70% ISPRP (MISCELLANEOUS) ×1
BLADE FULL RADIUS 3.5 (BLADE) ×2 IMPLANT
BLADE SHAVER 4.5X7 STR FR (MISCELLANEOUS) ×2 IMPLANT
BNDG CMPR 6 X 5 YARDS HK CLSR (GAUZE/BANDAGES/DRESSINGS) ×1
BNDG ELASTIC 6INX 5YD STR LF (GAUZE/BANDAGES/DRESSINGS) ×2 IMPLANT
BNDG ESMARCH 6 X 12 STRL LF (GAUZE/BANDAGES/DRESSINGS) ×1
BNDG ESMARCH 6X12 STRL LF (GAUZE/BANDAGES/DRESSINGS) ×2 IMPLANT
CATH ROBINSON RED A/P 12FR (CATHETERS) IMPLANT
CHLORAPREP W/TINT 26 (MISCELLANEOUS) ×2 IMPLANT
COLLECTOR GRAFT TISSUE (SYSTAGENIX WOUND MANAGEMENT) ×1
CUFF TOURN SGL QUICK 24 (TOURNIQUET CUFF)
CUFF TOURN SGL QUICK 34 (TOURNIQUET CUFF) ×1
CUFF TRNQT CYL 24X4X16.5-23 (TOURNIQUET CUFF) IMPLANT
CUFF TRNQT CYL 34X4.125X (TOURNIQUET CUFF) IMPLANT
CUP MEDICINE 2OZ PLAST GRAD ST (MISCELLANEOUS) ×2 IMPLANT
DRAPE ARTHRO LIMB 89X125 STRL (DRAPES) ×2 IMPLANT
DRAPE IMP U-DRAPE 54X76 (DRAPES) ×2 IMPLANT
ELECT REM PT RETURN 9FT ADLT (ELECTROSURGICAL) ×1
ELECTRODE REM PT RTRN 9FT ADLT (ELECTROSURGICAL) ×2 IMPLANT
GAUZE SPONGE 4X4 12PLY STRL (GAUZE/BANDAGES/DRESSINGS) ×2 IMPLANT
GLOVE BIO SURGEON STRL SZ8 (GLOVE) ×4 IMPLANT
GLOVE INDICATOR 8.0 STRL GRN (GLOVE) ×2 IMPLANT
GOWN STRL REUS W/ TWL LRG LVL3 (GOWN DISPOSABLE) ×2 IMPLANT
GOWN STRL REUS W/ TWL XL LVL3 (GOWN DISPOSABLE) ×4 IMPLANT
GOWN STRL REUS W/TWL LRG LVL3 (GOWN DISPOSABLE) ×1
GOWN STRL REUS W/TWL XL LVL3 (GOWN DISPOSABLE) ×2
IV LACTATED RINGER IRRG 3000ML (IV SOLUTION) ×1
IV LR IRRIG 3000ML ARTHROMATIC (IV SOLUTION) ×2 IMPLANT
KIT TURNOVER KIT A (KITS) ×2 IMPLANT
MANIFOLD NEPTUNE II (INSTRUMENTS) ×4 IMPLANT
NDL HYPO 21X1.5 SAFETY (NEEDLE) ×2 IMPLANT
NEEDLE HYPO 21X1.5 SAFETY (NEEDLE) ×1 IMPLANT
PACK ARTHROSCOPY KNEE (MISCELLANEOUS) ×2 IMPLANT
SLEEVE REMOTE CONTROL 5X12 (DRAPES) IMPLANT
SUT PROLENE 4 0 PS 2 18 (SUTURE) ×2 IMPLANT
SYR 30ML LL (SYRINGE) ×2 IMPLANT
SYR 50ML LL SCALE MARK (SYRINGE) ×2 IMPLANT
SYR TOOMEY 50ML (SYRINGE) IMPLANT
TISSUE GRAFT COLLECTOR (SYSTAGENIX WOUND MANAGEMENT) IMPLANT
TRAP FLUID SMOKE EVACUATOR (MISCELLANEOUS) ×2 IMPLANT
TUBE SET DOUBLEFLO INFLOW (TUBING) ×2 IMPLANT
WAND WEREWOLF FLOW 90D (MISCELLANEOUS) ×2 IMPLANT
WATER STERILE IRR 500ML POUR (IV SOLUTION) ×2 IMPLANT

## 2023-05-15 NOTE — Anesthesia Preprocedure Evaluation (Addendum)
Anesthesia Evaluation  Patient identified by MRN, date of birth, ID band Patient awake    Reviewed: Allergy & Precautions, H&P , NPO status , Patient's Chart, lab work & pertinent test results  Airway Mallampati: I  TM Distance: >3 FB Neck ROM: full    Dental   Pulmonary sleep apnea , former smoker Cough since yesterday after mowing   Pulmonary exam normal        Cardiovascular Exercise Tolerance: Good hypertension, Normal cardiovascular exam+ dysrhythmias (RBBB)      Neuro/Psych negative neurological ROS  negative psych ROS   GI/Hepatic negative GI ROS,,,(+)     substance abuse  alcohol use  Endo/Other  negative endocrine ROS    Renal/GU Chronic kidney disease  - has one kidney     Musculoskeletal  (+) Arthritis ,    Abdominal Normal abdominal exam  (+)   Peds  Hematology negative hematology ROS (+)   Anesthesia Other Findings Past Medical History: No date: Arthritis No date: Chronic kidney disease     Comment:  has one kidney No date: Hyperlipidemia No date: Hypertension No date: Medial meniscus tear No date: Obesity No date: OSA on CPAP No date: RBBB (right bundle branch block) No date: Single kidney     Comment:  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 PARTIAL MEDIAL MENISECTOMY;  Surgeon: Christena Flake,               MD;  Location: [facility];  Service: Orthopedics;                Laterality: Right; 2015: SALIVARY  STONE REMOVAL 09/07/2020: TOTAL KNEE ARTHROPLASTY; Left     Comment:  Procedure: TOTAL KNEE ARTHROPLASTY;  Surgeon: Christena Flake, MD;  Location: [facility];  Service: Orthopedics;                Laterality: Left;     Reproductive/Obstetrics negative OB ROS                              Anesthesia Physical Anesthesia Plan  ASA: 2  Anesthesia Plan: General LMA   Post-op Pain Management: Tylenol PO (pre-op)* and Gabapentin PO (pre-op)*   Induction: Intravenous  PONV Risk Score and Plan: 2 and Dexamethasone, Ondansetron and Midazolam  Airway Management Planned: LMA  Additional Equipment:   Intra-op Plan:   Post-operative Plan: Extubation in OR  Informed Consent: I have reviewed the patients History and Physical, chart, labs and discussed the procedure including the risks, benefits and alternatives for the proposed anesthesia with the patient or authorized representative who has indicated his/her understanding and acceptance.     Dental  Advisory Given  Plan Discussed with: Anesthesiologist, CRNA and Surgeon  Anesthesia Plan Comments:          Anesthesia Quick Evaluation

## 2023-05-15 NOTE — Discharge Instructions (Addendum)
Orthopedic discharge instructions: Keep dressing dry and intact.  May shower after dressing changed on post-op day #4 (Saturday).  Cover sutures with Band-Aids after drying off. Apply ice frequently to knee or use Polar Care device. Take ibuprofen 600-800 mg TID with meals for 3-5 days, then as necessary. Take pain medication as prescribed or ES Tylenol when needed.  May weight-bear as tolerated - use crutches or walker as needed. Follow-up in 10-14 days or as scheduled.AMBULATORY SURGERY  DISCHARGE INSTRUCTIONS   The drugs that you were given will stay in your system until tomorrow so for the next 24 hours you should not:  Drive an automobile Make any legal decisions Drink any alcoholic beverage   You may resume regular meals tomorrow.  Today it is better to start with liquids and gradually work up to solid foods.  You may eat anything you prefer, but it is better to start with liquids, then soup and crackers, and gradually work up to solid foods.   Please notify your doctor immediately if you have any unusual bleeding, trouble breathing, redness and pain at the surgery site, drainage, fever, or pain not relieved by medication.  Your post-operative visit with Dr.                                     is: Date:                        Time:    Please call to schedule your post-operative visit.  Additional Instructions:

## 2023-05-15 NOTE — Anesthesia Procedure Notes (Signed)
Procedure Name: LMA Insertion Date/Time: 05/15/2023 1:41 PM  Performed by: Maryla Morrow., CRNAPre-anesthesia Checklist: Patient identified, Patient being monitored, Timeout performed, Emergency Drugs available and Suction available Patient Re-evaluated:Patient Re-evaluated prior to induction Oxygen Delivery Method: Circle system utilized Preoxygenation: Pre-oxygenation with 100% oxygen Induction Type: IV induction Ventilation: Mask ventilation without difficulty LMA: LMA inserted LMA Size: 4.0 Tube type: Oral Number of attempts: 1 Placement Confirmation: positive ETCO2 and breath sounds checked- equal and bilateral Tube secured with: Tape Dental Injury: Teeth and Oropharynx as per pre-operative assessment

## 2023-05-15 NOTE — Transfer of Care (Signed)
Immediate Anesthesia Transfer of Care Note  Patient: Rodney Brooks.  Procedure(s) Performed: RIGHT KNEE ARTHROSCOPY WITH DEBRIDEMENT, PARTIAL MEDICAL MENISCECTOMY, AND REINJECTION OF HARVESTED INFRAPATELLAR FAT CELLS. (Right: Knee)  Patient Location: PACU  Anesthesia Type:General  Level of Consciousness: awake and alert   Airway & Oxygen Therapy: Patient Spontanous Breathing  Post-op Assessment: Report given to RN and Post -op Vital signs reviewed and stable  Post vital signs: stable  Last Vitals:  Vitals Value Taken Time  BP 154/91 05/15/23 1443  Temp    Pulse 87 05/15/23 1444  Resp 8 05/15/23 1444  SpO2 97 % 05/15/23 1444  Vitals shown include unfiled device data.  Last Pain:  Vitals:   05/15/23 1158  TempSrc: Temporal  PainSc: 0-No pain         Complications: No notable events documented.

## 2023-05-15 NOTE — Op Note (Signed)
05/15/2023  2:39 PM  Patient:   Rodney Brooks.  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  Postoperative diagnosis:   Same  Procedure:   Arthroscopic partial medial meniscectomy, abrasion chondroplasty of femoral trochlea, partial synovectomy, and reinjection of harvested infrapatellar fat cells, right knee.  Surgeon:   Maryagnes Amos, MD  Anesthesia:   General LMA  Findings:   As above.  There were grade 3-4 chondromalacial changes involving the medial and posterior portions of the medial tibial plateau, grade 3 chondromalacial changes of the femoral trochlea, and grade 2-3 chondromalacial changes of the medial femoral condyle.  The lateral compartment was in reasonable condition with grade 1 chondromalacial changes of the lateral tibial plateau.  The lateral meniscus was in satisfactory condition, as were the anterior and posterior cruciate ligaments.  Complications:   None  EBL:   5 cc.  Total fluids:   1000 cc of crystalloid.  Tourniquet time:   None  Drains:   None  Closure:   4-0 Prolene interrupted sutures.  Brief clinical note:   The patient is a 57 year old male with a 4-6 month history of recurrent medial sided right knee pain now 4 years status post arthroscopic partial medial meniscectomy.  The patient's symptoms are consistent with a recurrent medial meniscus tear, confirmed by a recent MRI scan. The MRI scan also demonstrated progressive degenerative changes of the medial more so than the patellofemoral compartment. The patient presents at this time for arthroscopy, debridement, partial medial meniscectomy, and reinjection of harvested fat cells of the right knee.  Procedure:   The patient was brought into the operating room and lain in the supine position. After adequate general laryngeal mask anesthesia was obtained, a timeout was performed to verify the appropriate side. The patient's right knee  was injected sterilely using a solution of 30 cc of 1% lidocaine and 30 cc of 0.5% Sensorcaine with epinephrine. The right lower extremity was prepped with ChloraPrep solution before being draped sterilely. Preoperative antibiotics were administered. The expected portal sites were injected with 0.5% Sensorcaine with epinephrine before the camera was placed in the anterolateral portal and instrumentation performed through the anteromedial portal.   The knee was sequentially examined beginning in the suprapatellar pouch, then progressing to the patellofemoral space, the medial gutter and compartment, the notch, and finally the lateral compartment and gutter. The findings were as described above. Abundant reactive synovial tissues anteriorly were debrided using the full-radius resector in order to improve visualization. This infrapatellar fatty tissue was collected using the Arthrex graft net system and kept on the back table for later reimplantation. The torn portion of the medial meniscus was carefully probed with the findings as described above. The area of recurrent tearing was debrided back to stable margins using a combination of the straight and side-biting baskets as well as the full-radius resector. Subsequent probing of the remaining rim demonstrated excellent stability. The areas of grade 3 chondromalacial changes in the femoral trochlea were abraded using the full-radius resector before being lightly "annealed" using the ArthroCare wand set at a low setting. The instruments were removed from the joint after suctioning the excess fluid.   The lateral portal site was closed using 4-0 Prolene interrupted sutures before the harvested infrapatellar fat cells were reinjected into the knee through the medial portal site using a red rubber catheter and Toomey syringe. The medial portal site was then reapproximated using 4-0 Prolene interrupted sutures. A sterile  bulky dressing was applied to the knee. The  patient was then awakened, extubated, and returned to the recovery room in satisfactory condition after tolerating the procedure well.

## 2023-05-15 NOTE — H&P (Signed)
History of Present Illness:  Rodney A Sherrard Amy. is a 57 y.o. male who presents for follow-up of his right knee pain secondary to degenerative joint disease with a possible recurrent medial meniscus tear. The patient notes mild improvement in his symptoms since his last visit. He continues to experience pain in the medial aspect of his knee, especially with prolonged standing or ambulation, or with deeper flexion activities. On today's visit, he rates his pain at 1/10. He is not taking any medications for discomfort at this time. Since his last visit, he has undergone an MRI scan of the right knee and presents today to review these results.  Current Outpatient Medications:  ezetimibe (ZETIA) 10 mg tablet Take 10 mg by mouth once daily  hydroCHLOROthiazide (HYDRODIURIL) 12.5 MG tablet Take 12.5 mg by mouth once daily.  lisinopril (PRINIVIL,ZESTRIL) 10 MG tablet Take 10 mg by mouth once daily.  sildenafiL (VIAGRA) 100 MG tablet Take 25 mg by mouth once as needed  simvastatin (ZOCOR) 40 MG tablet Take 40 mg by mouth once daily   Allergies:  Sulfa (Sulfonamide Antibiotics) Rash   Past Medical History:  Hyperlipidemia  Hypertension   Past Surgical History:  Arthroscopic medial meniscus repair with abrasion chondroplasty of grade 3 chondromalacial changes involving the medial femoral condyle medial tibial plateau, lateral tinial plateau and patella right knee Right 04/24/2019 (Dr. Joice Lofts)  Left TKA using all-cemented [employer] Vanguard system with a 67.5 mm mm PCR femur, a 75 mm tibial tray with a 10 mm anterior stabilized E-poly insert, and a 34 x 8.5 mm all-poly 3-pegged domed patella. Left 09/07/2020 (Dr. Joice Lofts)   Family History:  No Known Problems Mother  No Known Problems Father   Social History:   Socioeconomic History:  Marital status: Single  Occupational History  Occupation: [employer]  Tobacco Use  Smoking status: Never  Smokeless tobacco: Current  Types: Chew  Vaping Use  Vaping  status: Never Used  Substance and Sexual Activity  Alcohol use: Yes  Drug use: Never  Sexual activity: Yes  Partners: Female   Review of Systems:  A comprehensive 14 point ROS was performed, reviewed, and the pertinent orthopaedic findings are documented in the HPI.  Physical Exam: Vitals:  04/27/23 0857  BP: 132/86  Weight: (!) 112.4 kg (247 lb 12.8 oz)  Height: 177.8 cm (5\' 10" )  PainSc: 1  PainLoc: Knee   General/Constitutional: The patient appears to be well-nourished, well-developed, and in no acute distress. Neuro/Psych: Normal mood and affect, oriented to person, place and time. Eyes: Non-icteric. Pupils are equal, round, and reactive to light, and exhibit synchronous movement. ENT: Unremarkable. Lymphatic: No palpable adenopathy. Respiratory: Lungs clear to auscultation, Normal chest excursion, No wheezes, and Non-labored breathing Cardiovascular: Regular rate and rhythm. No murmurs. and No edema, swelling or tenderness, except as noted in detailed exam. Integumentary: No impressive skin lesions present, except as noted in detailed exam. Musculoskeletal: Unremarkable, except as noted in detailed exam.  Right knee exam: GAIT: Minimal limp, but uses no assistive devices. ALIGNMENT: Mild varus SKIN: Well-healed arthroscopic portal sites, otherwise unremarkable SWELLING: Minimal EFFUSION: Small WARMTH: None TENDERNESS: Mild tenderness posteriorly and along medial joint line, but no lateral joint line or peripatellar tenderness. ROM: 0 to 105 degrees with mild pain in maximal flexion McMURRAY'S: Positive PATELLOFEMORAL: Normal tracking with no peri-patellar tenderness and negative apprehension sign CREPITUS: None LACHMAN'S: Negative PIVOT SHIFT: Negative ANTERIOR DRAWER: Negative POSTERIOR DRAWER: Negative VARUS/VALGUS: Mildly positive pseudolaxity to varus stressing  He remains neurovascularly intact to  the right lower extremity and foot.  X-rays/MRI/Lab data:  A  recent MRI scan of the right knee is available for review and has been reviewed by myself. The official report is not yet available, but by my review, there does appear to be recurrent complex tear of the posteromedial portion of the medial meniscus, as well as moderate degenerative changes involving the medial compartment. The lateral and patellofemoral lateral compartments appear to be reasonably well-maintained. No ligamentous pathology is identified. This study was reviewed by myself and discussed with the patient.   Assessment: 1. Complex tear of medial meniscus of right knee. 2. Primary osteoarthritis of right knee.   Plan: The treatment options were discussed with the patient. In addition, patient educational materials were provided regarding the diagnosis and treatment options. The patient is quite frustrated by his continued symptoms and function limitations, and is ready to consider more aggressive treatment options. Therefore, I have recommended a surgical procedure, specifically a right knee arthroscopy with debridement, partial medial meniscectomy, and reinjection of infrapatellar fat cells. The procedure was discussed with the patient, as were the potential risks (including bleeding, infection, nerve and/or blood vessel injury, persistent or recurrent pain, failure of the repair, progression of arthritis, need for further surgery, blood clots, strokes, heart attacks and/or arhythmias, pneumonia, etc.) and benefits. The patient states his understanding and wishes to proceed. All of the patient's questions and concerns were answered. He can call any time with further concerns. He will follow up post-surgery, routine.    H&P reviewed and patient re-examined. No changes.

## 2023-05-16 ENCOUNTER — Encounter: Payer: Self-pay | Admitting: Surgery

## 2023-05-16 NOTE — Anesthesia Postprocedure Evaluation (Signed)
Anesthesia Post Note  Patient: Rodney Brooks.  Procedure(s) Performed: RIGHT KNEE ARTHROSCOPY WITH DEBRIDEMENT, PARTIAL MEDICAL MENISCECTOMY, AND REINJECTION OF HARVESTED INFRAPATELLAR FAT CELLS. (Right: Knee)  Patient location during evaluation: PACU Anesthesia Type: General Level of consciousness: awake and alert Pain management: pain level controlled Vital Signs Assessment: post-procedure vital signs reviewed and stable Respiratory status: spontaneous breathing, nonlabored ventilation, respiratory function stable and patient connected to nasal cannula oxygen Cardiovascular status: blood pressure returned to baseline and stable Postop Assessment: no apparent nausea or vomiting Anesthetic complications: no   There were no known notable events for this encounter.   Last Vitals:  Vitals:   05/15/23 1515 05/15/23 1540  BP: (!) 149/86 (!) 151/91  Pulse: 92 90  Resp: 18 16  Temp: 36.6 C (!) 36.3 C  SpO2: 94% 94%    Last Pain:  Vitals:   05/15/23 1540  TempSrc: Temporal  PainSc: 0-No pain                 Corinda Gubler

## 2024-08-14 ENCOUNTER — Other Ambulatory Visit: Payer: Self-pay | Admitting: Internal Medicine

## 2024-08-14 DIAGNOSIS — I10 Essential (primary) hypertension: Secondary | ICD-10-CM

## 2024-08-14 DIAGNOSIS — R079 Chest pain, unspecified: Secondary | ICD-10-CM

## 2024-09-03 ENCOUNTER — Telehealth (HOSPITAL_COMMUNITY): Payer: Self-pay | Admitting: Emergency Medicine

## 2024-09-03 NOTE — Telephone Encounter (Signed)
 Reaching out to patient to offer assistance regarding upcoming cardiac imaging study; pt verbalizes understanding of appt date/time, parking situation and where to check in, pre-test NPO status and medications ordered, and verified current allergies; name and call back number provided for further questions should they arise Rockwell Alexandria RN Navigator Cardiac Imaging [facility] [phone] office [phone] cell

## 2024-09-04 ENCOUNTER — Ambulatory Visit
Admission: RE | Admit: 2024-09-04 | Discharge: 2024-09-04 | Disposition: A | Source: Ambulatory Visit | Attending: Internal Medicine | Admitting: Internal Medicine

## 2024-09-04 DIAGNOSIS — I10 Essential (primary) hypertension: Secondary | ICD-10-CM | POA: Diagnosis present

## 2024-09-04 DIAGNOSIS — R079 Chest pain, unspecified: Secondary | ICD-10-CM | POA: Diagnosis present

## 2024-09-04 MED ORDER — METOPROLOL TARTRATE 5 MG/5ML IV SOLN
10.0000 mg | Freq: Once | INTRAVENOUS | Status: DC | PRN
  Filled 2024-09-04: qty 10

## 2024-09-04 MED ORDER — DILTIAZEM HCL 25 MG/5ML IV SOLN
10.0000 mg | INTRAVENOUS | Status: DC | PRN
  Filled 2024-09-04: qty 5

## 2024-09-04 MED ORDER — NITROGLYCERIN 0.4 MG SL SUBL
0.8000 mg | SUBLINGUAL_TABLET | Freq: Once | SUBLINGUAL | Status: AC
  Administered 2024-09-04: 0.8 mg via SUBLINGUAL
  Filled 2024-09-04: qty 25

## 2024-09-04 MED ORDER — IOHEXOL 350 MG/ML SOLN
100.0000 mL | Freq: Once | INTRAVENOUS | Status: AC | PRN
  Administered 2024-09-04: 100 mL via INTRAVENOUS

## 2024-09-04 NOTE — Progress Notes (Signed)
 Patient tolerated CT well. Vital signs stable encourage to drink water throughout day.Reasons explained and verbalized understanding. Ambulated steady gait.
